# Patient Record
Sex: Male | Born: 1960
Health system: Southern US, Community
[De-identification: ages and names within clinical notes are randomized; demographics above are authoritative.]

## PROBLEM LIST (undated history)

## (undated) DIAGNOSIS — I10 Essential (primary) hypertension: Secondary | ICD-10-CM

---

## 2014-11-20 ENCOUNTER — Other Ambulatory Visit: Payer: Self-pay | Admitting: Specialist

## 2014-11-20 ENCOUNTER — Ambulatory Visit
Admission: RE | Admit: 2014-11-20 | Discharge: 2014-11-20 | Disposition: A | Discharge status: Home / Self Care | Payer: 59 | Source: Ambulatory Visit | Attending: Specialist | Admitting: Specialist

## 2014-11-20 DIAGNOSIS — M545 Low back pain: Secondary | ICD-10-CM

## 2014-12-12 ENCOUNTER — Other Ambulatory Visit: Payer: Self-pay | Admitting: Specialist

## 2014-12-12 DIAGNOSIS — M545 Low back pain: Secondary | ICD-10-CM

## 2014-12-17 ENCOUNTER — Ambulatory Visit
Admission: RE | Admit: 2014-12-17 | Discharge: 2014-12-17 | Disposition: A | Discharge status: Home / Self Care | Payer: 59 | Source: Ambulatory Visit | Attending: Specialist | Admitting: Specialist

## 2014-12-17 ENCOUNTER — Ambulatory Visit: Payer: Self-pay | Admitting: Orthopedic Surgery

## 2014-12-17 DIAGNOSIS — M545 Low back pain: Secondary | ICD-10-CM

## 2014-12-21 ENCOUNTER — Other Ambulatory Visit: Payer: 59

## 2015-03-13 DIAGNOSIS — R350 Frequency of micturition: Secondary | ICD-10-CM | POA: Diagnosis not present

## 2015-03-13 DIAGNOSIS — Z23 Encounter for immunization: Secondary | ICD-10-CM | POA: Diagnosis not present

## 2015-03-13 DIAGNOSIS — M545 Low back pain: Secondary | ICD-10-CM | POA: Diagnosis not present

## 2015-03-13 DIAGNOSIS — Z0000 Encounter for general adult medical examination without abnormal findings: Secondary | ICD-10-CM | POA: Diagnosis not present

## 2015-03-17 DIAGNOSIS — Z125 Encounter for screening for malignant neoplasm of prostate: Secondary | ICD-10-CM | POA: Diagnosis not present

## 2015-05-01 DIAGNOSIS — I119 Hypertensive heart disease without heart failure: Secondary | ICD-10-CM | POA: Diagnosis not present

## 2015-05-01 DIAGNOSIS — M545 Low back pain: Secondary | ICD-10-CM | POA: Diagnosis not present

## 2015-05-22 DIAGNOSIS — I119 Hypertensive heart disease without heart failure: Secondary | ICD-10-CM | POA: Diagnosis not present

## 2015-05-22 DIAGNOSIS — M1990 Unspecified osteoarthritis, unspecified site: Secondary | ICD-10-CM | POA: Diagnosis not present

## 2015-06-12 DIAGNOSIS — I119 Hypertensive heart disease without heart failure: Secondary | ICD-10-CM | POA: Diagnosis not present

## 2015-06-17 DIAGNOSIS — M5416 Radiculopathy, lumbar region: Secondary | ICD-10-CM | POA: Diagnosis not present

## 2015-06-24 DIAGNOSIS — M5416 Radiculopathy, lumbar region: Secondary | ICD-10-CM | POA: Diagnosis not present

## 2015-06-26 DIAGNOSIS — M5416 Radiculopathy, lumbar region: Secondary | ICD-10-CM | POA: Diagnosis not present

## 2015-07-02 DIAGNOSIS — M5416 Radiculopathy, lumbar region: Secondary | ICD-10-CM | POA: Diagnosis not present

## 2015-07-06 DIAGNOSIS — M5416 Radiculopathy, lumbar region: Secondary | ICD-10-CM | POA: Diagnosis not present

## 2015-07-10 DIAGNOSIS — M5416 Radiculopathy, lumbar region: Secondary | ICD-10-CM | POA: Diagnosis not present

## 2015-07-10 DIAGNOSIS — I119 Hypertensive heart disease without heart failure: Secondary | ICD-10-CM | POA: Diagnosis not present

## 2015-07-10 DIAGNOSIS — M1990 Unspecified osteoarthritis, unspecified site: Secondary | ICD-10-CM | POA: Diagnosis not present

## 2015-07-16 DIAGNOSIS — M5416 Radiculopathy, lumbar region: Secondary | ICD-10-CM | POA: Diagnosis not present

## 2015-07-20 DIAGNOSIS — M5416 Radiculopathy, lumbar region: Secondary | ICD-10-CM | POA: Diagnosis not present

## 2015-07-20 DIAGNOSIS — G8929 Other chronic pain: Secondary | ICD-10-CM | POA: Diagnosis not present

## 2015-07-20 DIAGNOSIS — M546 Pain in thoracic spine: Secondary | ICD-10-CM | POA: Diagnosis not present

## 2015-07-23 DIAGNOSIS — M5416 Radiculopathy, lumbar region: Secondary | ICD-10-CM | POA: Diagnosis not present

## 2015-07-27 DIAGNOSIS — M5416 Radiculopathy, lumbar region: Secondary | ICD-10-CM | POA: Diagnosis not present

## 2015-07-30 DIAGNOSIS — M5416 Radiculopathy, lumbar region: Secondary | ICD-10-CM | POA: Diagnosis not present

## 2015-08-03 DIAGNOSIS — M5416 Radiculopathy, lumbar region: Secondary | ICD-10-CM | POA: Diagnosis not present

## 2015-08-06 DIAGNOSIS — M5416 Radiculopathy, lumbar region: Secondary | ICD-10-CM | POA: Diagnosis not present

## 2015-08-10 DIAGNOSIS — M5416 Radiculopathy, lumbar region: Secondary | ICD-10-CM | POA: Diagnosis not present

## 2015-08-13 DIAGNOSIS — M5416 Radiculopathy, lumbar region: Secondary | ICD-10-CM | POA: Diagnosis not present

## 2015-08-17 DIAGNOSIS — K648 Other hemorrhoids: Secondary | ICD-10-CM | POA: Diagnosis not present

## 2015-08-17 DIAGNOSIS — K645 Perianal venous thrombosis: Secondary | ICD-10-CM | POA: Diagnosis not present

## 2015-08-17 DIAGNOSIS — Z1211 Encounter for screening for malignant neoplasm of colon: Secondary | ICD-10-CM | POA: Diagnosis not present

## 2015-08-18 DIAGNOSIS — M5416 Radiculopathy, lumbar region: Secondary | ICD-10-CM | POA: Diagnosis not present

## 2015-08-21 DIAGNOSIS — I119 Hypertensive heart disease without heart failure: Secondary | ICD-10-CM | POA: Diagnosis not present

## 2015-08-31 DIAGNOSIS — M5416 Radiculopathy, lumbar region: Secondary | ICD-10-CM | POA: Diagnosis not present

## 2015-09-02 DIAGNOSIS — H5203 Hypermetropia, bilateral: Secondary | ICD-10-CM | POA: Diagnosis not present

## 2015-09-03 DIAGNOSIS — M5416 Radiculopathy, lumbar region: Secondary | ICD-10-CM | POA: Diagnosis not present

## 2015-09-09 DIAGNOSIS — M5416 Radiculopathy, lumbar region: Secondary | ICD-10-CM | POA: Diagnosis not present

## 2015-09-15 DIAGNOSIS — M5416 Radiculopathy, lumbar region: Secondary | ICD-10-CM | POA: Diagnosis not present

## 2015-09-21 DIAGNOSIS — M5416 Radiculopathy, lumbar region: Secondary | ICD-10-CM | POA: Diagnosis not present

## 2015-09-24 DIAGNOSIS — M5416 Radiculopathy, lumbar region: Secondary | ICD-10-CM | POA: Diagnosis not present

## 2015-09-28 DIAGNOSIS — M5416 Radiculopathy, lumbar region: Secondary | ICD-10-CM | POA: Diagnosis not present

## 2015-10-08 DIAGNOSIS — M546 Pain in thoracic spine: Secondary | ICD-10-CM | POA: Diagnosis not present

## 2015-10-19 DIAGNOSIS — M546 Pain in thoracic spine: Secondary | ICD-10-CM | POA: Diagnosis not present

## 2015-10-26 DIAGNOSIS — M546 Pain in thoracic spine: Secondary | ICD-10-CM | POA: Diagnosis not present

## 2015-11-23 DIAGNOSIS — M5416 Radiculopathy, lumbar region: Secondary | ICD-10-CM | POA: Diagnosis not present

## 2015-12-18 DIAGNOSIS — I10 Essential (primary) hypertension: Secondary | ICD-10-CM | POA: Diagnosis not present

## 2015-12-18 DIAGNOSIS — M1990 Unspecified osteoarthritis, unspecified site: Secondary | ICD-10-CM | POA: Diagnosis not present

## 2016-05-06 DIAGNOSIS — Z0000 Encounter for general adult medical examination without abnormal findings: Secondary | ICD-10-CM | POA: Diagnosis not present

## 2016-05-06 DIAGNOSIS — M5136 Other intervertebral disc degeneration, lumbar region: Secondary | ICD-10-CM | POA: Diagnosis not present

## 2016-05-06 DIAGNOSIS — Z6841 Body Mass Index (BMI) 40.0 and over, adult: Secondary | ICD-10-CM | POA: Diagnosis not present

## 2016-05-06 DIAGNOSIS — K219 Gastro-esophageal reflux disease without esophagitis: Secondary | ICD-10-CM | POA: Diagnosis not present

## 2016-05-06 DIAGNOSIS — I10 Essential (primary) hypertension: Secondary | ICD-10-CM | POA: Diagnosis not present

## 2016-05-10 DIAGNOSIS — I10 Essential (primary) hypertension: Secondary | ICD-10-CM | POA: Diagnosis not present

## 2016-07-01 DIAGNOSIS — I10 Essential (primary) hypertension: Secondary | ICD-10-CM | POA: Diagnosis not present

## 2016-07-01 DIAGNOSIS — K219 Gastro-esophageal reflux disease without esophagitis: Secondary | ICD-10-CM | POA: Diagnosis not present

## 2016-07-14 DIAGNOSIS — K210 Gastro-esophageal reflux disease with esophagitis: Secondary | ICD-10-CM | POA: Diagnosis not present

## 2016-07-14 DIAGNOSIS — K219 Gastro-esophageal reflux disease without esophagitis: Secondary | ICD-10-CM | POA: Diagnosis not present

## 2016-11-18 DIAGNOSIS — R030 Elevated blood-pressure reading, without diagnosis of hypertension: Secondary | ICD-10-CM | POA: Diagnosis not present

## 2016-11-18 DIAGNOSIS — K210 Gastro-esophageal reflux disease with esophagitis: Secondary | ICD-10-CM | POA: Diagnosis not present

## 2017-06-17 DIAGNOSIS — R55 Syncope and collapse: Secondary | ICD-10-CM | POA: Diagnosis not present

## 2017-06-17 DIAGNOSIS — R42 Dizziness and giddiness: Secondary | ICD-10-CM | POA: Diagnosis not present

## 2017-06-17 DIAGNOSIS — R1111 Vomiting without nausea: Secondary | ICD-10-CM | POA: Diagnosis not present

## 2017-06-27 DIAGNOSIS — R197 Diarrhea, unspecified: Secondary | ICD-10-CM | POA: Diagnosis not present

## 2017-07-12 DIAGNOSIS — R7982 Elevated C-reactive protein (CRP): Secondary | ICD-10-CM | POA: Diagnosis not present

## 2017-07-12 DIAGNOSIS — R110 Nausea: Secondary | ICD-10-CM | POA: Diagnosis not present

## 2017-07-12 DIAGNOSIS — R945 Abnormal results of liver function studies: Secondary | ICD-10-CM | POA: Diagnosis not present

## 2017-07-21 DIAGNOSIS — I10 Essential (primary) hypertension: Secondary | ICD-10-CM | POA: Diagnosis not present

## 2017-07-21 DIAGNOSIS — Z6841 Body Mass Index (BMI) 40.0 and over, adult: Secondary | ICD-10-CM | POA: Diagnosis not present

## 2017-08-22 DIAGNOSIS — B029 Zoster without complications: Secondary | ICD-10-CM | POA: Diagnosis not present

## 2017-09-08 DIAGNOSIS — B0230 Zoster ocular disease, unspecified: Secondary | ICD-10-CM | POA: Diagnosis not present

## 2018-03-28 ENCOUNTER — Emergency Department (HOSPITAL_COMMUNITY)
Admission: EM | Admit: 2018-03-28 | Discharge: 2018-03-28 | Disposition: A | Discharge status: Home / Self Care | Payer: 59 | Attending: Emergency Medicine | Admitting: Emergency Medicine

## 2018-03-28 ENCOUNTER — Encounter (HOSPITAL_COMMUNITY): Payer: Self-pay

## 2018-03-28 DIAGNOSIS — R55 Syncope and collapse: Secondary | ICD-10-CM | POA: Diagnosis not present

## 2018-03-28 DIAGNOSIS — I10 Essential (primary) hypertension: Secondary | ICD-10-CM | POA: Insufficient documentation

## 2018-03-28 DIAGNOSIS — E860 Dehydration: Secondary | ICD-10-CM | POA: Insufficient documentation

## 2018-03-28 DIAGNOSIS — R001 Bradycardia, unspecified: Secondary | ICD-10-CM | POA: Diagnosis present

## 2018-03-28 HISTORY — DX: Essential (primary) hypertension: I10

## 2018-03-28 LAB — COMPREHENSIVE METABOLIC PANEL
ALBUMIN: 4.4 g/dL (ref 3.5–5.0)
ALT: 26 U/L (ref 0–44)
AST: 24 U/L (ref 15–41)
Alkaline Phosphatase: 61 U/L (ref 38–126)
Anion gap: 8 (ref 5–15)
BUN: 25 mg/dL — ABNORMAL HIGH (ref 6–20)
CO2: 26 mmol/L (ref 22–32)
CREATININE: 1.13 mg/dL (ref 0.61–1.24)
Calcium: 9.2 mg/dL (ref 8.9–10.3)
Chloride: 106 mmol/L (ref 98–111)
GFR calc Af Amer: >60 mL/min (ref >60)
GFR calc non Af Amer: >60 mL/min (ref >60)
Glucose, Bld: 137 mg/dL — ABNORMAL HIGH (ref 70–99)
Potassium: 3.8 mmol/L (ref 3.5–5.1)
Sodium: 140 mmol/L (ref 135–145)
Total Bilirubin: 0.9 mg/dL (ref 0.3–1.2)
Total Protein: 7.6 g/dL (ref 6.5–8.1)

## 2018-03-28 LAB — CBC
HCT: 47.6 % (ref 39.0–52.0)
Hemoglobin: 15.6 g/dL (ref 13.0–17.0)
MCH: 29.3 pg (ref 26.0–34.0)
MCHC: 32.8 g/dL (ref 30.0–36.0)
MCV: 89.5 fL (ref 80.0–100.0)
Platelets: 300 10*3/uL (ref 150–400)
RBC: 5.32 MIL/uL (ref 4.22–5.81)
RDW: 12.5 % (ref 11.5–15.5)
WBC: 7.6 10*3/uL (ref 4.0–10.5)
nRBC: 0 % (ref 0.0–0.2)

## 2018-03-28 LAB — URINALYSIS, ROUTINE W REFLEX MICROSCOPIC
Bilirubin Urine: NEGATIVE
Glucose, UA: NEGATIVE mg/dL
Hgb urine dipstick: NEGATIVE
Ketones, ur: NEGATIVE mg/dL
Leukocytes,Ua: NEGATIVE
Nitrite: NEGATIVE
Protein, ur: NEGATIVE mg/dL
SPECIFIC GRAVITY, URINE: 1.018 (ref 1.005–1.030)
pH: 5 (ref 5.0–8.0)

## 2018-03-28 LAB — CBG MONITORING, ED: Glucose-Capillary: 106 mg/dL — ABNORMAL HIGH (ref 70–99)

## 2018-03-28 LAB — TROPONIN I
Troponin I: <0.03 ng/mL (ref <0.03)
Troponin I: <0.03 ng/mL (ref <0.03)

## 2018-03-28 MED ORDER — LOSARTAN POTASSIUM 50 MG PO TABS: 50.0000 mg | ORAL_TABLET | Freq: Every day | ORAL | 0 refills | Status: DC

## 2018-03-28 MED ORDER — ONDANSETRON HCL 4 MG/2ML IJ SOLN: 4.0000 mg | Freq: Once | INTRAMUSCULAR | Status: DC

## 2018-03-28 MED ORDER — SODIUM CHLORIDE 0.9 % IV BOLUS
1000.0000 mL | Freq: Once | INTRAVENOUS | Status: AC
  Administered 2018-03-28: 1000 mL via INTRAVENOUS

## 2018-03-28 MED ORDER — SODIUM CHLORIDE 0.9 % IV BOLUS
500.0000 mL | Freq: Once | INTRAVENOUS | Status: AC
  Administered 2018-03-28: 500 mL via INTRAVENOUS

## 2018-03-28 NOTE — ED Notes (Signed)
Save tubes sent to lab. All colors.

## 2018-03-28 NOTE — ED Notes (Signed)
Venora Maples, MD ambulated patient around unit. No problems noted. Patient states, "I feel a lot better".

## 2018-03-28 NOTE — ED Notes (Signed)
Family at bedside. Patient using cell phone. Patient also has ice chips and is ok per Venora Maples, MD.

## 2018-03-28 NOTE — ED Notes (Signed)
Campos, MD at bedside.  

## 2018-03-28 NOTE — ED Triage Notes (Signed)
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 Patient was standing while talking on his phone and felt "funny". Patient sat down.   Patient denies falling or LOC. Patient passed out in a chair. Patient denies being over or standing up fast.   Noted to be pale.    A/Ox4  Patient has not ate today which in not unusual for him.   Patient states he has one other episode similar to this at home last June.

## 2018-03-28 NOTE — ED Notes (Signed)
Patient refusing wheelchair. Patient left with wife. Per Venora Maples, MD discharge patient regardless of 2nd troponin coming back.

## 2018-03-28 NOTE — ED Provider Notes (Signed)
[place] [facility] DEPT Provider Note   CSN: [id number] Arrival date & time: 03/28/18  0859    History   Chief Complaint Chief Complaint  Patient presents with   Bradycardia    HPI Blake Khan is a 58 y.o. male.     HPI Patient is a 58 year old male presents the emergency department complaints of syncopal episode while at work today.  He had preceding lightheadedness without palpitations or chest discomfort.  He felt slightly nauseated.  He was walking in the hall and sat down in a chair and the next thing he knew everyone was standing around him.  He had a similar syncopal episode back in the summer 2019.  This was transient.  EMS was called at that time.  He never sought evaluation in the emergency department at that time.  He did follow-up with his primary care physician and had no significant abnormalities on additional work-up besides mild LFTs that were then rechecked and resolved.  On my initial evaluation in the cancer center the patient was pale and clammy.  His initial heart rate was 44 on hand-held pulse oximeter without pulse ox 97%.  He had a faint radial pulse.  He was alert and talking.  No preceding chest pain or palpitations.  Denies abdominal and back pain.  On transport to the emergency department he began to feel better.  He reports some mild indigestion-like symptoms over the week with burning in his subxiphoid region with associated burping he had none of those symptoms today.Marland Kitchen  No prior history of cardiac disease.  None of the symptoms are present at this time.  No recent illness.  No cough or congestion.  No fevers or chills.  Denies melena or hematochezia.  No recent change in his medications.  He continues to bike often and has no chest discomfort or shortness of breath associated with this.  He states he went on a long ride this weekend without any significant difficulty.  He does report mild decreased oral intake in general and tends to  probably not drink enough fluid.  No other recent sick contacts.  Past Medical History:  Diagnosis Date   Hypertension     There are no active problems to display for this patient.   History reviewed. No pertinent surgical history.      Home Medications    Prior to Admission medications   Not on File    Family History History reviewed. No pertinent family history.  Social History Social History   Tobacco Use   Smoking status: Never Smoker   Smokeless tobacco: Never Used  Substance Use Topics   Alcohol use: Yes   Drug use: Never     Allergies   Sulfa antibiotics   Review of Systems Review of Systems  All other systems reviewed and are negative.    Physical Exam Updated Vital Signs BP 115/80 (BP Location: Left Arm)    Pulse (!) 52    Temp (!) 97.4 F (36.3 C) (Oral)    Resp 15    SpO2 100%   Physical Exam Vitals signs and nursing note reviewed.  Constitutional:      Appearance: Normal appearance. He is well-developed.  HENT:     Head: Normocephalic and atraumatic.     Mouth/Throat:     Mouth: Mucous membranes are dry.  Neck:     Musculoskeletal: Normal range of motion.  Cardiovascular:     Rate and Rhythm: Normal rate and regular rhythm.  Heart sounds: Normal heart sounds.  Pulmonary:     Effort: Pulmonary effort is normal. No respiratory distress.     Breath sounds: Normal breath sounds.  Abdominal:     General: There is no distension.     Palpations: Abdomen is soft.     Tenderness: There is no abdominal tenderness.  Musculoskeletal: Normal range of motion.  Skin:    General: Skin is warm and dry.     Coloration: Skin is not jaundiced.  Neurological:     General: No focal deficit present.     Mental Status: He is alert and oriented to person, place, and time.  Psychiatric:        Mood and Affect: Mood normal.        Judgment: Judgment normal.      ED Treatments / Results  Labs (all labs ordered are listed, but only  abnormal results are displayed) Labs Reviewed  COMPREHENSIVE METABOLIC PANEL - Abnormal; Notable for the following components:      Result Value   Glucose, Bld 137 (*)    BUN 25 (*)    All other components within normal limits  CBG MONITORING, ED - Abnormal; Notable for the following components:   Glucose-Capillary 106 (*)    All other components within normal limits  CBC  TROPONIN I  URINALYSIS, ROUTINE W REFLEX MICROSCOPIC  TROPONIN I    EKG  ECG interpretation #1 0900am   Date: 03/28/2018  Rate: 51  Rhythm: normal sinus rhythm  QRS Axis: normal  Intervals: normal  ST/T Wave abnormalities: early repol pattern. No depression Conduction Disutrbances: none  Narrative Interpretation:   Old EKG Reviewed: no ecg available    ECG interpretation #2 0909am  Date: 03/28/2018  Rate: 54  Rhythm: normal sinus rhythm  QRS Axis: normal  Intervals: normal  ST/T Wave abnormalities:  early repol pattern. No depression   Conduction Disutrbances: none  Narrative Interpretation:   Old EKG Reviewed: No significant changes noted     EKG Interpretation #3 Date/Time:  Wednesday March 28 2018 09:09:45 EDT Ventricular Rate:  54 PR Interval:    QRS Duration: 102 QT Interval:  435 QTC Calculation: 413 R Axis:   73 Text Interpretation:  Sinus rhythm No significant change was found Confirmed by Jola Schmidt [id number]) on 03/28/2018 10:02:21 AM    Radiology No results found.  Procedures Procedures (including critical care time)  Medications Ordered in ED Medications  ondansetron (ZOFRAN) injection 4 mg (0 mg Intravenous Hold 03/28/18 1003)  sodium chloride 0.9 % bolus 500 mL (0 mLs Intravenous Stopped 03/28/18 0957)  sodium chloride 0.9 % bolus 1,000 mL (0 mLs Intravenous Stopped 03/28/18 1114)     Initial Impression / Assessment and Plan / ED Course  I have reviewed the triage vital signs and the nursing notes.  Pertinent labs & imaging results that were available during my  care of the patient were reviewed by me and considered in my medical decision making (see chart for details).        Patient observed in the emergency department for 2 hours.  Received 1.5 L of IV fluids.  Feels much better.  This may have represented dehydration with an associated vagal episode.  No preceding chest pain or palpitations.  EKG x3 without ischemic changes.  Troponin negative.  Labs demonstrate no significant abnormality.  He may have a slightly hemoconcentrated hemoglobin of 15.6.  This would argue for mild dehydration.  Initially after a 500 cc of fluid he  still felt lightheaded when he stood up.  After additional 1 L he felt much better.  We ambulated around the emergency department without any difficulty.  He feels much better.  We will take his losartan and decrease it by half.  A new prescription was called in.  He will follow-up with his primary care physician next week for recheck as well as a repeat evaluation of his blood pressure.  He understands return to the emergency department for any new or worsening symptoms.  Close PCP follow-up.  Patient agreeable to outpatient plan.  Plan described at length to the patient as well as the patient's spouse.  Encouraged ongoing oral hydration at home  Final Clinical Impressions(s) / ED Diagnoses   Final diagnoses:  Vasovagal syncope  Dehydration    ED Discharge Orders         Ordered    losartan (COZAAR) 50 MG tablet  Daily     03/28/18 1121           Jola Schmidt, MD 03/28/18 1135

## 2018-03-28 NOTE — ED Notes (Signed)
Pt has been informed that urine collection is needed  

## 2018-03-28 NOTE — ED Notes (Signed)
Bed: RESA Expected date: 03/28/18 Expected time: 8:51 AM Means of arrival:  Comments: Low heart rate

## 2018-03-28 NOTE — ED Notes (Signed)
Per Venora Maples, MD patient can PO fluids and food. Patient given diet coke and crackers with peanut butter.

## 2018-04-06 DIAGNOSIS — R55 Syncope and collapse: Secondary | ICD-10-CM | POA: Diagnosis not present

## 2018-05-30 ENCOUNTER — Encounter: Payer: Self-pay | Admitting: Radiation Oncology

## 2018-05-30 ENCOUNTER — Other Ambulatory Visit: Payer: Self-pay | Admitting: Radiation Oncology

## 2018-05-30 ENCOUNTER — Other Ambulatory Visit (HOSPITAL_COMMUNITY)
Admission: RE | Admit: 2018-05-30 | Discharge: 2018-05-30 | Disposition: A | Discharge status: Home / Self Care | Payer: 59 | Source: Other Acute Inpatient Hospital | Attending: Radiation Oncology | Admitting: Radiation Oncology

## 2018-05-30 DIAGNOSIS — Z1159 Encounter for screening for other viral diseases: Secondary | ICD-10-CM | POA: Diagnosis not present

## 2018-05-30 LAB — SARS CORONAVIRUS 2 BY RT PCR (HOSPITAL ORDER, PERFORMED IN ~~LOC~~ HOSPITAL LAB): SARS Coronavirus 2: NEGATIVE

## 2018-05-30 NOTE — Progress Notes (Signed)
COVID test ordered stat to be done in [facility] lab due to symptoms.

## 2018-06-05 ENCOUNTER — Other Ambulatory Visit: Payer: Self-pay | Admitting: *Deleted

## 2018-06-05 DIAGNOSIS — R55 Syncope and collapse: Secondary | ICD-10-CM

## 2018-06-06 ENCOUNTER — Telehealth: Payer: Self-pay | Admitting: *Deleted

## 2018-06-06 NOTE — Telephone Encounter (Signed)
Confirmed patient received address confirmation call from [employer].  They will ship a 14 day Cardiac event monitor to his home. Upon receipt ,review instructions, apply monitor, and call [employer] at [phone] to send baseline recording.

## 2018-06-09 ENCOUNTER — Ambulatory Visit (INDEPENDENT_AMBULATORY_CARE_PROVIDER_SITE_OTHER): Payer: 59

## 2018-06-09 DIAGNOSIS — R55 Syncope and collapse: Secondary | ICD-10-CM | POA: Diagnosis not present

## 2018-07-11 ENCOUNTER — Other Ambulatory Visit: Payer: Self-pay | Admitting: Internal Medicine

## 2018-07-11 DIAGNOSIS — R55 Syncope and collapse: Secondary | ICD-10-CM

## 2018-08-10 DIAGNOSIS — I10 Essential (primary) hypertension: Secondary | ICD-10-CM | POA: Diagnosis not present

## 2018-08-10 DIAGNOSIS — K210 Gastro-esophageal reflux disease with esophagitis: Secondary | ICD-10-CM | POA: Diagnosis not present

## 2018-09-26 DIAGNOSIS — H5203 Hypermetropia, bilateral: Secondary | ICD-10-CM | POA: Diagnosis not present

## 2019-08-21 ENCOUNTER — Other Ambulatory Visit: Payer: Self-pay

## 2019-08-21 ENCOUNTER — Ambulatory Visit: Payer: Self-pay

## 2019-08-21 ENCOUNTER — Encounter: Payer: Self-pay | Admitting: Family Medicine

## 2019-08-21 ENCOUNTER — Ambulatory Visit (INDEPENDENT_AMBULATORY_CARE_PROVIDER_SITE_OTHER): Payer: 59 | Admitting: Family Medicine

## 2019-08-21 VITALS — BP 148/90 | HR 68 | Ht 72.5 in | Wt 215.2 lb

## 2019-08-21 DIAGNOSIS — M9261 Juvenile osteochondrosis of tarsus, right ankle: Secondary | ICD-10-CM | POA: Diagnosis not present

## 2019-08-21 DIAGNOSIS — M9262 Juvenile osteochondrosis of tarsus, left ankle: Secondary | ICD-10-CM

## 2019-08-21 DIAGNOSIS — M722 Plantar fascial fibromatosis: Secondary | ICD-10-CM | POA: Diagnosis not present

## 2019-08-21 NOTE — Patient Instructions (Addendum)
Thank you for coming in today. Consider a night splint. Do the ice massage  Do the eccentric exercises.  Go down slowly. Do about 30 reps 2-3x daily.  If not improving let me know.  Can do xray or MRI.

## 2019-08-21 NOTE — Progress Notes (Signed)
° ° °  Subjective:    CC: B foot pain  I, Molly Weber, LAT, ATC, am serving as scribe for Dr. Lynne Leader.  HPI: Pt is a 59 y/o male presenting w/ c/o B heel and plantar foot pain x 2 months.  R foot is worse than L.  He notes pain at the posterior and plantar right heel and on the plantar surface of his entire foot to the forefoot.  He notes the pain is sometimes burning.  Pain is typically worse with prolonged standing.  He is an avid cyclist and notes that although he does not have pain with cycling he is wondering if it is making it worse.  He cannot recall any injuries or potential other causes of pain.  Aggravating factors: Standing, walking, driving long distances Treatments tried: stretching; ice; shoe inserts from a running store; heel cups  Pertinent review of Systems: No fevers or chills  Relevant historical information: History lumbar fusion at L5-S1.  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   Objective:    Vitals:   08/21/19 1321  BP: (!) 148/90  Pulse: 68  SpO2: 97%   General: Well Developed, well nourished, and in no acute distress.   MSK: Right foot prominence at medial posterior calcaneus Achilles tendon insertion. Normal-appearing otherwise. Tender palpation at posterior medial calcaneus.  Not particularly tender plantar calcaneus or forefoot or midfoot. Normal foot and ankle motion. Normal strength. Pulses cap refill and sensation are intact distally.  Left foot largely normal-appearing Not particularly tender. Normal foot and ankle motion. Pulses cap refill and sensation are intact.  Lab and Radiology Results  Diagnostic Limited MSK Ultrasound of: Right foot Achilles tendon normal-appearing At insertion increased hyperechoic change consistent with calcific tendinopathy. Tiny retrocalcaneal bursa also present. Plantar calcaneus normal-appearing with normal-appearing plantar fascia tracking towards forefoot. Impression: Achilles tendinitis with  calcific change.  Possible plantar fasciitis  Diagnostic Limited MSK Ultrasound of: Left foot Normal Achilles tendon tilt insertion.  Increased hyperechoic change at Achilles tendon insertion onto posterior calcaneus consistent with calcific tendinopathy Normal-appearing plantar fascia Impression: Achilles tendinitis calcific change     Impression and Recommendations:    Assessment and Plan: 59 y.o. male with right worse than left plantar and posterior foot pain.  Most consistent with Achilles tendinitis and plantar fasciitis.  Patient describes a burning sensation in the plantar right foot that may be radicular or neuropathy related. Plan to maximize conservative management letter fasciitis with gel heel cups, night splint, eccentric exercises and Voltaren gel. If not improving would broaden work-up including imaging of foot as well as potential repeat imaging L-spine to evaluate for potential lumbar radiculopathy.  Could also include nerve conduction study as needed in the future..   Orders Placed This Encounter  Procedures   [place] LIMITED JOINT SPACE STRUCTURES LOW BILAT(NO LINKED CHARGES)    Order Specific Question:   Reason for Exam (SYMPTOM  OR DIAGNOSIS REQUIRED)    Answer:   plantar fasciitis    Order Specific Question:   Preferred imaging location?    Answer:   [place]   No orders of the defined types were placed in this encounter.   Discussed warning signs or symptoms. Please see discharge instructions. Patient expresses understanding.   The above documentation has been reviewed and is accurate and complete Lynne Leader, M.D.

## 2019-09-17 ENCOUNTER — Telehealth: Payer: Self-pay | Admitting: Internal Medicine

## 2019-09-17 MED ORDER — PANTOPRAZOLE SODIUM 40 MG PO TBEC: 40.0000 mg | DELAYED_RELEASE_TABLET | Freq: Every day | ORAL | 0 refills | Status: DC

## 2019-09-17 MED ORDER — LOSARTAN POTASSIUM 50 MG PO TABS: 50.0000 mg | ORAL_TABLET | Freq: Every day | ORAL | 0 refills | Status: DC

## 2019-09-17 NOTE — Telephone Encounter (Signed)
OK to est PCP w/me  Pls sch appt - best Wed - late PM Text or call Dr Benay Spice at [phone] Meds renewed x 3 mo Thx

## 2019-09-19 NOTE — Telephone Encounter (Signed)
Unable to reach pt and vm not set up, will try again later

## 2019-09-20 NOTE — Telephone Encounter (Signed)
Appointment has been made

## 2019-10-09 ENCOUNTER — Encounter: Payer: Self-pay | Admitting: Internal Medicine

## 2019-10-09 ENCOUNTER — Ambulatory Visit (INDEPENDENT_AMBULATORY_CARE_PROVIDER_SITE_OTHER): Payer: 59 | Admitting: Internal Medicine

## 2019-10-09 ENCOUNTER — Other Ambulatory Visit: Payer: Self-pay

## 2019-10-09 VITALS — BP 150/82 | HR 74 | Temp 98.2°F | Ht 70.05 in | Wt 214.0 lb

## 2019-10-09 DIAGNOSIS — E559 Vitamin D deficiency, unspecified: Secondary | ICD-10-CM

## 2019-10-09 DIAGNOSIS — K219 Gastro-esophageal reflux disease without esophagitis: Secondary | ICD-10-CM | POA: Insufficient documentation

## 2019-10-09 DIAGNOSIS — E785 Hyperlipidemia, unspecified: Secondary | ICD-10-CM

## 2019-10-09 DIAGNOSIS — I10 Essential (primary) hypertension: Secondary | ICD-10-CM | POA: Insufficient documentation

## 2019-10-09 DIAGNOSIS — R55 Syncope and collapse: Secondary | ICD-10-CM | POA: Insufficient documentation

## 2019-10-09 DIAGNOSIS — D485 Neoplasm of uncertain behavior of skin: Secondary | ICD-10-CM | POA: Insufficient documentation

## 2019-10-09 DIAGNOSIS — Z0000 Encounter for general adult medical examination without abnormal findings: Secondary | ICD-10-CM | POA: Insufficient documentation

## 2019-10-09 DIAGNOSIS — H6120 Impacted cerumen, unspecified ear: Secondary | ICD-10-CM | POA: Insufficient documentation

## 2019-10-09 DIAGNOSIS — M545 Low back pain, unspecified: Secondary | ICD-10-CM | POA: Insufficient documentation

## 2019-10-09 DIAGNOSIS — R202 Paresthesia of skin: Secondary | ICD-10-CM

## 2019-10-09 DIAGNOSIS — H6123 Impacted cerumen, bilateral: Secondary | ICD-10-CM

## 2019-10-09 MED ORDER — VITAMIN D3 50 MCG (2000 UT) PO CAPS: 2000.0000 [IU] | ORAL_CAPSULE | Freq: Every day | ORAL | 3 refills | Status: DC

## 2019-10-09 NOTE — Patient Instructions (Signed)

## 2019-10-09 NOTE — Assessment & Plan Note (Signed)
Cor calc CT offered 

## 2019-10-09 NOTE — Assessment & Plan Note (Signed)
Use OTC wax removing kit

## 2019-10-09 NOTE — Progress Notes (Signed)
Subjective:  Patient ID: Blake Khan, male    DOB: 11-07-1960  Age: 59 y.o. MRN: [id number]  CC: No chief complaint on file.   HPI Blake Khan presents for a well exam - a new pt visit F/u on HTN, GERD H/o syncope 03/2018 with a (-) w/up - vaso-vagal  H/o LBP H/o shingles on LLE  Outpatient Medications Prior to Visit  Medication Sig Dispense Refill  . losartan (COZAAR) 50 MG tablet Take 1 tablet (50 mg total) by mouth daily. 90 tablet 0  . pantoprazole (PROTONIX) 40 MG tablet Take 1 tablet (40 mg total) by mouth daily. 90 tablet 0   No facility-administered medications prior to visit.    ROS: Review of Systems  Constitutional: Negative for appetite change, fatigue and unexpected weight change.  HENT: Negative for congestion, nosebleeds, sneezing, sore throat and trouble swallowing.   Eyes: Negative for itching and visual disturbance.  Respiratory: Negative for cough.   Cardiovascular: Negative for chest pain, palpitations and leg swelling.  Gastrointestinal: Negative for abdominal distention, blood in stool, diarrhea and nausea.  Genitourinary: Negative for frequency and hematuria.  Musculoskeletal: Negative for back pain, gait problem, joint swelling and neck pain.  Skin: Negative for rash.  Neurological: Negative for dizziness, tremors, speech difficulty and weakness.  Psychiatric/Behavioral: Negative for agitation, dysphoric mood, sleep disturbance and suicidal ideas. The patient is not nervous/anxious.     Objective:  BP (!) 150/82 (BP Location: Left Arm, Patient Position: Sitting, Cuff Size: Large)   Pulse 74   Temp 98.2 F (36.8 C) (Oral)   Ht 5' 10.05" (1.779 m)   Wt 214 lb (97.1 kg)   SpO2 97%   BMI 30.66 kg/m   BP Readings from Last 3 Encounters:  10/09/19 (!) 150/82  08/21/19 (!) 148/90  03/28/18 (!) 141/89    Wt Readings from Last 3 Encounters:  10/09/19 214 lb (97.1 kg)  08/21/19 215 lb 3.2 oz (97.6 kg)    Physical Exam Constitutional:       General: He is not in acute distress.    Appearance: He is well-developed.     Comments: NAD  Eyes:     Conjunctiva/sclera: Conjunctivae normal.     Pupils: Pupils are equal, round, and reactive to light.  Neck:     Thyroid: No thyromegaly.     Vascular: No JVD.  Cardiovascular:     Rate and Rhythm: Normal rate and regular rhythm.     Heart sounds: Normal heart sounds. No murmur heard.  No friction rub. No gallop.   Pulmonary:     Effort: Pulmonary effort is normal. No respiratory distress.     Breath sounds: Normal breath sounds. No wheezing or rales.  Chest:     Chest wall: No tenderness.  Abdominal:     General: Bowel sounds are normal. There is no distension.     Palpations: Abdomen is soft. There is no mass.     Tenderness: There is no abdominal tenderness. There is no guarding or rebound.  Musculoskeletal:        General: No tenderness. Normal range of motion.     Cervical back: Normal range of motion.  Lymphadenopathy:     Cervical: No cervical adenopathy.  Skin:    General: Skin is warm and dry.     Findings: No rash.  Neurological:     Mental Status: He is alert and oriented to person, place, and time.     Cranial Nerves: No cranial nerve  deficit.     Motor: No abnormal muscle tone.     Coordination: Coordination normal.     Gait: Gait normal.     Deep Tendon Reflexes: Reflexes are normal and symmetric.  Psychiatric:        Behavior: Behavior normal.        Thought Content: Thought content normal.        Judgment: Judgment normal.     Lab Results  Component Value Date   WBC 7.6 03/28/2018   HGB 15.6 03/28/2018   HCT 47.6 03/28/2018   PLT 300 03/28/2018   GLUCOSE 137 (H) 03/28/2018   ALT 26 03/28/2018   AST 24 03/28/2018   NA 140 03/28/2018   K 3.8 03/28/2018   CL 106 03/28/2018   CREATININE 1.13 03/28/2018   BUN 25 (H) 03/28/2018   CO2 26 03/28/2018    No results found.  Assessment & Plan:   Walker Kehr, MD

## 2019-10-09 NOTE — Assessment & Plan Note (Signed)
Losartan Check BP at home; states SBP<130

## 2019-10-09 NOTE — Assessment & Plan Note (Signed)
Recurrent since L5-S1 fusion at 59 yo

## 2019-10-09 NOTE — Assessment & Plan Note (Signed)
We discussed age appropriate health related issues, including available/recomended screening tests and vaccinations. Labs were ordered to be later reviewed . All questions were answered. We discussed one or more of the following - seat belt use, use of sunscreen/sun exposure exercise, safe sex, fall risk reduction, second hand smoke exposure, firearm use and storage, seat belt use, a need for adhering to healthy diet and exercise. Labs were ordered.  All questions were answered. Colon 2017 Dr Earlean Shawl Cor calcium CT offered 9/21 tDAP 2017Hep C(-) 2018 PSA 1.1 2020 Brad declined Shingrx

## 2019-10-09 NOTE — Assessment & Plan Note (Signed)
L temple - ?SD Cryo or shave bx offered

## 2019-10-09 NOTE — Assessment & Plan Note (Signed)
No relapse 

## 2019-10-09 NOTE — Assessment & Plan Note (Signed)
On Protonix 

## 2019-10-15 ENCOUNTER — Other Ambulatory Visit (INDEPENDENT_AMBULATORY_CARE_PROVIDER_SITE_OTHER): Payer: 59

## 2019-10-15 DIAGNOSIS — R202 Paresthesia of skin: Secondary | ICD-10-CM | POA: Diagnosis not present

## 2019-10-15 DIAGNOSIS — E559 Vitamin D deficiency, unspecified: Secondary | ICD-10-CM

## 2019-10-15 DIAGNOSIS — Z125 Encounter for screening for malignant neoplasm of prostate: Secondary | ICD-10-CM

## 2019-10-15 DIAGNOSIS — Z0000 Encounter for general adult medical examination without abnormal findings: Secondary | ICD-10-CM | POA: Diagnosis not present

## 2019-10-15 LAB — URINALYSIS
Bilirubin Urine: NEGATIVE
Hgb urine dipstick: NEGATIVE
Ketones, ur: NEGATIVE
Leukocytes,Ua: NEGATIVE
Nitrite: NEGATIVE
Specific Gravity, Urine: >=1.03 — AB (ref 1.000–1.030)
Total Protein, Urine: NEGATIVE
Urine Glucose: NEGATIVE
Urobilinogen, UA: 0.2 (ref 0.0–1.0)
pH: 5.5 (ref 5.0–8.0)

## 2019-10-15 LAB — CBC WITH DIFFERENTIAL/PLATELET
Basophils Absolute: 0.1 K/uL (ref 0.0–0.1)
Basophils Relative: 1 % (ref 0.0–3.0)
Eosinophils Absolute: 0.2 K/uL (ref 0.0–0.7)
Eosinophils Relative: 2.4 % (ref 0.0–5.0)
HCT: 45.8 % (ref 39.0–52.0)
Hemoglobin: 15.6 g/dL (ref 13.0–17.0)
Lymphocytes Relative: 48.2 % — ABNORMAL HIGH (ref 12.0–46.0)
Lymphs Abs: 3.1 K/uL (ref 0.7–4.0)
MCHC: 34.1 g/dL (ref 30.0–36.0)
MCV: 86.3 fl (ref 78.0–100.0)
Monocytes Absolute: 0.6 K/uL (ref 0.1–1.0)
Monocytes Relative: 9.3 % (ref 3.0–12.0)
Neutro Abs: 2.5 K/uL (ref 1.4–7.7)
Neutrophils Relative %: 39.1 % — ABNORMAL LOW (ref 43.0–77.0)
Platelets: 251 K/uL (ref 150.0–400.0)
RBC: 5.31 Mil/uL (ref 4.22–5.81)
RDW: 13.4 % (ref 11.5–15.5)
WBC: 6.4 K/uL (ref 4.0–10.5)

## 2019-10-15 LAB — VITAMIN D 25 HYDROXY (VIT D DEFICIENCY, FRACTURES): VITD: 21.61 ng/mL — ABNORMAL LOW (ref 30.00–100.00)

## 2019-10-15 LAB — COMPREHENSIVE METABOLIC PANEL
ALT: 23 U/L (ref 0–53)
AST: 21 U/L (ref 0–37)
Albumin: 4.7 g/dL (ref 3.5–5.2)
Alkaline Phosphatase: 62 U/L (ref 39–117)
BUN: 33 mg/dL — ABNORMAL HIGH (ref 6–23)
CO2: 27 mEq/L (ref 19–32)
Calcium: 9.6 mg/dL (ref 8.4–10.5)
Chloride: 105 mEq/L (ref 96–112)
Creatinine, Ser: 1.24 mg/dL (ref 0.40–1.50)
GFR: 59.68 mL/min — ABNORMAL LOW (ref >60.00)
Glucose, Bld: 122 mg/dL — ABNORMAL HIGH (ref 70–99)
Potassium: 4.1 mEq/L (ref 3.5–5.1)
Sodium: 140 mEq/L (ref 135–145)
Total Bilirubin: 0.5 mg/dL (ref 0.2–1.2)
Total Protein: 7.5 g/dL (ref 6.0–8.3)

## 2019-10-15 LAB — LIPID PANEL
Cholesterol: 172 mg/dL (ref 0–200)
HDL: 38.3 mg/dL — ABNORMAL LOW
LDL Cholesterol: 116 mg/dL — ABNORMAL HIGH (ref 0–99)
NonHDL: 133.73
Total CHOL/HDL Ratio: 4
Triglycerides: 88 mg/dL (ref 0.0–149.0)
VLDL: 17.6 mg/dL (ref 0.0–40.0)

## 2019-10-15 LAB — TSH: TSH: 2.33 u[IU]/mL (ref 0.35–4.50)

## 2019-10-15 LAB — PSA: PSA: 1.5 ng/mL (ref 0.10–4.00)

## 2019-10-15 LAB — VITAMIN B12: Vitamin B-12: 206 pg/mL — ABNORMAL LOW (ref 211–911)

## 2019-10-15 NOTE — Addendum Note (Signed)
Addended by: Trenda Moots on: 01/14/9966 07:37 AM   Modules accepted: Orders

## 2019-10-16 ENCOUNTER — Encounter: Payer: Self-pay | Admitting: Internal Medicine

## 2019-10-16 ENCOUNTER — Other Ambulatory Visit: Payer: Self-pay | Admitting: Internal Medicine

## 2019-10-16 DIAGNOSIS — E559 Vitamin D deficiency, unspecified: Secondary | ICD-10-CM

## 2019-10-16 DIAGNOSIS — E538 Deficiency of other specified B group vitamins: Secondary | ICD-10-CM | POA: Insufficient documentation

## 2019-10-16 DIAGNOSIS — R944 Abnormal results of kidney function studies: Secondary | ICD-10-CM

## 2019-10-16 MED ORDER — VITAMIN D3 1.25 MG (50000 UT) PO CAPS: 1.0000 | ORAL_CAPSULE | ORAL | 0 refills | Status: DC

## 2019-10-16 MED ORDER — VITAMIN B-12 1000 MCG SL SUBL: 1.0000 | SUBLINGUAL_TABLET | Freq: Every day | SUBLINGUAL | 3 refills | Status: AC

## 2019-10-16 NOTE — Progress Notes (Signed)
Hi Blake Khan, Your labs show that your vitamin B12 is low at 206.  Your vitamin D is low at 21.  Your glucose was slightly elevated at 123.  Otherwise your tests are good. Please, start Vit D prescription 50000 iu weekly (Rx emailed to your pharmacy) followed by over-the-counter Vit D 2000 iu daily. I will send a prescription for vitamin B12 sublingual to your pharmacy.  Can you get a B12 shot at your office? Your GFR was slightly borderline/decreased.  Please hydrate yourself well.  I would like to obtain a renal ultrasound to be sure.  I will order the test. Can you sign up for Mychart, please? We should repeat your labs in 3 months. Thanks, AP

## 2019-10-18 NOTE — Addendum Note (Signed)
Addended by: Earnstine Regal on: 10/18/2019 01:37 PM   Modules accepted: Orders

## 2019-10-18 NOTE — Progress Notes (Signed)
HAD TO RE-ENTER ORDER DUE TO WRONG LOCATION.Marland Kitchen/LMB

## 2019-10-24 ENCOUNTER — Ambulatory Visit
Admission: RE | Admit: 2019-10-24 | Discharge: 2019-10-24 | Disposition: A | Discharge status: Home / Self Care | Payer: 59 | Source: Ambulatory Visit | Attending: Internal Medicine | Admitting: Internal Medicine

## 2019-10-24 DIAGNOSIS — R944 Abnormal results of kidney function studies: Secondary | ICD-10-CM

## 2019-10-24 DIAGNOSIS — N400 Enlarged prostate without lower urinary tract symptoms: Secondary | ICD-10-CM | POA: Diagnosis not present

## 2019-10-24 DIAGNOSIS — N3289 Other specified disorders of bladder: Secondary | ICD-10-CM | POA: Diagnosis not present

## 2019-11-04 ENCOUNTER — Telehealth: Payer: Self-pay | Admitting: *Deleted

## 2019-11-04 ENCOUNTER — Inpatient Hospital Stay: Payer: 59 | Attending: Internal Medicine

## 2019-11-04 DIAGNOSIS — Z23 Encounter for immunization: Secondary | ICD-10-CM | POA: Diagnosis not present

## 2019-11-04 NOTE — Telephone Encounter (Signed)
Error

## 2019-11-04 NOTE — Progress Notes (Signed)
Patient was observed post Covid-19 immunization for 15 minutes . No reaction noted. Patient was instructed to call 911 with any severe reactions post vaccine:  Difficulty breathing   Swelling of face and throat   A fast heartbeat   A bad rash all over body   Dizziness and weakness   

## 2019-12-23 ENCOUNTER — Other Ambulatory Visit: Payer: Self-pay | Admitting: Internal Medicine

## 2019-12-23 ENCOUNTER — Other Ambulatory Visit: Payer: Self-pay

## 2019-12-23 MED ORDER — PANTOPRAZOLE SODIUM 40 MG PO TBEC: 40.0000 mg | DELAYED_RELEASE_TABLET | Freq: Every day | ORAL | 0 refills | Status: DC

## 2019-12-23 MED ORDER — LOSARTAN POTASSIUM 50 MG PO TABS: 50.0000 mg | ORAL_TABLET | Freq: Every day | ORAL | 0 refills | Status: DC

## 2020-03-30 ENCOUNTER — Other Ambulatory Visit: Payer: Self-pay

## 2020-03-30 ENCOUNTER — Other Ambulatory Visit: Payer: Self-pay | Admitting: Internal Medicine

## 2020-03-30 MED ORDER — LOSARTAN POTASSIUM 50 MG PO TABS: 50.0000 mg | ORAL_TABLET | Freq: Every day | ORAL | 1 refills | Status: DC

## 2020-03-30 MED ORDER — PANTOPRAZOLE SODIUM 40 MG PO TBEC: 40.0000 mg | DELAYED_RELEASE_TABLET | Freq: Every day | ORAL | 1 refills | Status: DC

## 2020-04-02 ENCOUNTER — Other Ambulatory Visit (HOSPITAL_BASED_OUTPATIENT_CLINIC_OR_DEPARTMENT_OTHER): Payer: Self-pay

## 2020-05-22 ENCOUNTER — Other Ambulatory Visit (HOSPITAL_BASED_OUTPATIENT_CLINIC_OR_DEPARTMENT_OTHER): Payer: Self-pay

## 2020-05-22 ENCOUNTER — Ambulatory Visit: Payer: 59 | Attending: Internal Medicine

## 2020-05-22 ENCOUNTER — Other Ambulatory Visit (HOSPITAL_COMMUNITY): Payer: Self-pay

## 2020-05-22 ENCOUNTER — Other Ambulatory Visit: Payer: Self-pay

## 2020-05-22 DIAGNOSIS — Z23 Encounter for immunization: Secondary | ICD-10-CM

## 2020-05-22 MED ORDER — PFIZER-BIONT COVID-19 VAC-TRIS 30 MCG/0.3ML IM SUSP
INTRAMUSCULAR | 0 refills | Status: DC
  Filled 2020-05-22: qty 0.3, 1d supply, fill #0

## 2020-05-22 NOTE — Progress Notes (Signed)
   Covid-19 Vaccination Clinic  Name:  Blake Khan    MRN: [id number] DOB: 15-May-1960  05/22/2020  Mr. Blake Khan was observed post Covid-19 immunization for 15 minutes without incident. He was provided with Vaccine Information Sheet and instruction to access the V-Safe system.   Mr. Blake Khan was instructed to call 911 with any severe reactions post vaccine: Marland Kitchen Difficulty breathing  . Swelling of face and throat  . A fast heartbeat  . A bad rash all over body  . Dizziness and weakness   Immunizations Administered    Name Date Dose VIS Date Route   PFIZER Comrnaty(Gray TOP) Covid-19 Vaccine 05/22/2020  7:48 AM 0.3 mL 12/19/2019 Intramuscular   Manufacturer: [employer], [employer]   Lot: [id number]   NDC: [phone]

## 2020-06-29 ENCOUNTER — Other Ambulatory Visit (HOSPITAL_COMMUNITY): Payer: Self-pay

## 2020-06-29 MED FILL — Losartan Potassium Tab 50 MG: ORAL | 90 days supply | Qty: 90 | Fill #0 | Status: AC

## 2020-06-29 MED FILL — Pantoprazole Sodium EC Tab 40 MG (Base Equiv): ORAL | 90 days supply | Qty: 90 | Fill #0 | Status: AC

## 2020-09-01 ENCOUNTER — Other Ambulatory Visit (HOSPITAL_COMMUNITY): Payer: Self-pay

## 2020-09-01 MED ORDER — PREDNISONE 5 MG (21) PO TBPK
ORAL_TABLET | ORAL | 0 refills | Status: DC
  Filled 2020-09-01: qty 21, 6d supply, fill #0

## 2020-09-15 DIAGNOSIS — M5416 Radiculopathy, lumbar region: Secondary | ICD-10-CM | POA: Diagnosis not present

## 2020-09-22 DIAGNOSIS — M5416 Radiculopathy, lumbar region: Secondary | ICD-10-CM | POA: Diagnosis not present

## 2020-09-29 ENCOUNTER — Other Ambulatory Visit: Payer: Self-pay | Admitting: Internal Medicine

## 2020-09-29 DIAGNOSIS — M5416 Radiculopathy, lumbar region: Secondary | ICD-10-CM | POA: Diagnosis not present

## 2020-09-29 DIAGNOSIS — E538 Deficiency of other specified B group vitamins: Secondary | ICD-10-CM

## 2020-09-29 DIAGNOSIS — E559 Vitamin D deficiency, unspecified: Secondary | ICD-10-CM

## 2020-09-29 DIAGNOSIS — Z0000 Encounter for general adult medical examination without abnormal findings: Secondary | ICD-10-CM

## 2020-09-30 ENCOUNTER — Other Ambulatory Visit (INDEPENDENT_AMBULATORY_CARE_PROVIDER_SITE_OTHER): Payer: 59

## 2020-09-30 DIAGNOSIS — E559 Vitamin D deficiency, unspecified: Secondary | ICD-10-CM

## 2020-09-30 DIAGNOSIS — Z125 Encounter for screening for malignant neoplasm of prostate: Secondary | ICD-10-CM | POA: Diagnosis not present

## 2020-09-30 DIAGNOSIS — Z0000 Encounter for general adult medical examination without abnormal findings: Secondary | ICD-10-CM

## 2020-09-30 DIAGNOSIS — E538 Deficiency of other specified B group vitamins: Secondary | ICD-10-CM

## 2020-09-30 LAB — CBC WITH DIFFERENTIAL/PLATELET
Basophils Absolute: 0.1 10*3/uL (ref 0.0–0.1)
Basophils Relative: 0.8 % (ref 0.0–3.0)
Eosinophils Absolute: 0.1 10*3/uL (ref 0.0–0.7)
Eosinophils Relative: 2 % (ref 0.0–5.0)
HCT: 44.3 % (ref 39.0–52.0)
Hemoglobin: 15.1 g/dL (ref 13.0–17.0)
Lymphocytes Relative: 41.8 % (ref 12.0–46.0)
Lymphs Abs: 2.9 10*3/uL (ref 0.7–4.0)
MCHC: 34.1 g/dL (ref 30.0–36.0)
MCV: 86.8 fl (ref 78.0–100.0)
Monocytes Absolute: 0.6 10*3/uL (ref 0.1–1.0)
Monocytes Relative: 9 % (ref 3.0–12.0)
Neutro Abs: 3.2 10*3/uL (ref 1.4–7.7)
Neutrophils Relative %: 46.4 % (ref 43.0–77.0)
Platelets: 274 10*3/uL (ref 150.0–400.0)
RBC: 5.11 Mil/uL (ref 4.22–5.81)
RDW: 13.5 % (ref 11.5–15.5)
WBC: 6.9 10*3/uL (ref 4.0–10.5)

## 2020-09-30 LAB — URINALYSIS
Bilirubin Urine: NEGATIVE
Hgb urine dipstick: NEGATIVE
Ketones, ur: NEGATIVE
Leukocytes,Ua: NEGATIVE
Nitrite: NEGATIVE
Specific Gravity, Urine: 1.025 (ref 1.000–1.030)
Total Protein, Urine: NEGATIVE
Urine Glucose: NEGATIVE
Urobilinogen, UA: 0.2 — AB (ref 0.0–1.0)
pH: 5.5 (ref 5.0–8.0)

## 2020-09-30 LAB — TSH: TSH: 2.57 u[IU]/mL (ref 0.35–5.50)

## 2020-09-30 LAB — VITAMIN D 25 HYDROXY (VIT D DEFICIENCY, FRACTURES): VITD: 35.41 ng/mL (ref 30.00–100.00)

## 2020-09-30 LAB — PSA: PSA: 1.9 ng/mL (ref 0.10–4.00)

## 2020-09-30 LAB — VITAMIN B12: Vitamin B-12: 357 pg/mL (ref 211–911)

## 2020-10-01 LAB — LIPID PANEL
Cholesterol: 176 mg/dL (ref 0–200)
HDL: 42.3 mg/dL (ref >39.00)
LDL Cholesterol: 109 mg/dL — ABNORMAL HIGH (ref 0–99)
NonHDL: 133.2
Total CHOL/HDL Ratio: 4
Triglycerides: 120 mg/dL (ref 0.0–149.0)
VLDL: 24 mg/dL (ref 0.0–40.0)

## 2020-10-01 LAB — COMPREHENSIVE METABOLIC PANEL
ALT: 21 U/L (ref 0–53)
AST: 20 U/L (ref 0–37)
Albumin: 4.6 g/dL (ref 3.5–5.2)
Alkaline Phosphatase: 59 U/L (ref 39–117)
BUN: 24 mg/dL — ABNORMAL HIGH (ref 6–23)
CO2: 25 mEq/L (ref 19–32)
Calcium: 9.8 mg/dL (ref 8.4–10.5)
Chloride: 103 mEq/L (ref 96–112)
Creatinine, Ser: 1.2 mg/dL (ref 0.40–1.50)
GFR: 66 mL/min (ref >60.00)
Glucose, Bld: 94 mg/dL (ref 70–99)
Potassium: 3.8 mEq/L (ref 3.5–5.1)
Sodium: 141 mEq/L (ref 135–145)
Total Bilirubin: 0.5 mg/dL (ref 0.2–1.2)
Total Protein: 7.6 g/dL (ref 6.0–8.3)

## 2020-10-06 DIAGNOSIS — M5416 Radiculopathy, lumbar region: Secondary | ICD-10-CM | POA: Diagnosis not present

## 2020-10-08 ENCOUNTER — Other Ambulatory Visit: Payer: Self-pay | Admitting: Internal Medicine

## 2020-10-09 ENCOUNTER — Ambulatory Visit (INDEPENDENT_AMBULATORY_CARE_PROVIDER_SITE_OTHER): Payer: 59 | Admitting: Internal Medicine

## 2020-10-09 ENCOUNTER — Other Ambulatory Visit (HOSPITAL_COMMUNITY): Payer: Self-pay

## 2020-10-09 ENCOUNTER — Other Ambulatory Visit: Payer: Self-pay

## 2020-10-09 ENCOUNTER — Encounter: Payer: Self-pay | Admitting: Internal Medicine

## 2020-10-09 VITALS — BP 141/80 | HR 67 | Temp 98.1°F | Ht 70.5 in | Wt 216.0 lb

## 2020-10-09 DIAGNOSIS — I10 Essential (primary) hypertension: Secondary | ICD-10-CM | POA: Diagnosis not present

## 2020-10-09 DIAGNOSIS — Z0000 Encounter for general adult medical examination without abnormal findings: Secondary | ICD-10-CM

## 2020-10-09 DIAGNOSIS — E785 Hyperlipidemia, unspecified: Secondary | ICD-10-CM | POA: Diagnosis not present

## 2020-10-09 MED ORDER — LOSARTAN POTASSIUM 50 MG PO TABS
ORAL_TABLET | Freq: Every day | ORAL | 3 refills | Status: DC
  Filled 2020-10-09: qty 90, 90d supply, fill #0
  Filled 2021-01-12: qty 90, 90d supply, fill #1
  Filled 2021-04-15: qty 90, 90d supply, fill #2
  Filled 2021-07-21: qty 90, 90d supply, fill #3

## 2020-10-09 MED ORDER — PANTOPRAZOLE SODIUM 40 MG PO TBEC
DELAYED_RELEASE_TABLET | Freq: Every day | ORAL | 3 refills | Status: DC
  Filled 2020-10-09: qty 90, 90d supply, fill #0
  Filled 2021-01-12: qty 90, 90d supply, fill #1
  Filled 2021-04-15: qty 90, 90d supply, fill #2
  Filled 2021-07-21: qty 90, 90d supply, fill #3

## 2020-10-09 NOTE — Patient Instructions (Addendum)
The Obesity Code book by Sharman Cheek   These suggestions will probably help you to improve your metabolism if you are not overweight and to lose weight if you are overweight: 1.  Reduce your consumption of sugars and starches.  Eliminate high fructose corn syrup from your diet.  Reduce your consumption of processed foods.  For desserts try to have seasonal fruits, berries, nuts, cheeses or dark chocolate with more than 70% cacao. 2.  Do not snack 3.  You do not have to eat breakfast.  If you choose to have breakfast - eat plain greek yogurt, eggs, oatmeal (without sugar) - use honey if you need to. 4.  Drink water, freshly brewed unsweetened tea (green, black or herbal) or coffee.  Do not drink sodas including diet sodas , juices, beverages sweetened with artificial sweeteners. 5.  Reduce your consumption of refined grains. 6.  Avoid protein drinks such as Optifast, Slim fast etc. Eat chicken, fish, meat, dairy and beans for your sources of protein. 7.  Natural unprocessed fats like cold pressed virgin olive oil, butter, coconut oil are good for you.  Eat avocados. 8.  Increase your consumption of fiber.  Fruits, berries, vegetables, whole grains, flaxseed, chia seeds, beans, popcorn, nuts, oatmeal are good sources of fiber 9.  Use vinegar in your diet, i.e. apple cider vinegar, red wine or balsamic vinegar 10.  You can try fasting.  For example you can skip breakfast and lunch every other day (24-hour fast) 11.  Stress reduction, good night sleep, relaxation, meditation, yoga and other physical activity is likely to help you to maintain low weight too. 12.  If you drink alcohol, limit your alcohol intake to no more than 2 drinks a day.     Cabbage soup recipe that will not make you gain weight: Take 1 small head of cabbage, 1 average pack of celery, 4 green peppers, 4 onions, 2 cans diced tomatoes (they are not available without salt), salt and spices to taste.  Chop cabbage, celery, peppers and  onions.  And tomatoes and 2-2.5 liters (2.5 quarts) of water so that it would just cover the vegetables.  Bring to boil.  Add spices and salt.  Turn heat to low/medium and simmer for 20-25 minutes.  Naturally, you can make a smaller batch and change some of the ingredients.  Cardiac CT calcium scoring test $99 Tel # is [phone]   Computed tomography, more commonly known as a CT or CAT scan, is a diagnostic medical imaging test. Like traditional x-rays, it produces multiple images or pictures of the inside of the body. The cross-sectional images generated during a CT scan can be reformatted in multiple planes. They can even generate three-dimensional images. These images can be viewed on a computer monitor, printed on film or by a 3D printer, or transferred to a CD or DVD. CT images of internal organs, bones, soft tissue and blood vessels provide greater detail than traditional x-rays, particularly of soft tissues and blood vessels. A cardiac CT scan for coronary calcium is a non-invasive way of obtaining information about the presence, location and extent of calcified plaque in the coronary arteries--the vessels that supply oxygen-containing blood to the heart muscle. Calcified plaque results when there is a build-up of fat and other substances under the inner layer of the artery. This material can calcify which signals the presence of atherosclerosis, a disease of the vessel wall, also called coronary artery disease (CAD). People with this disease have an increased risk  for heart attacks. In addition, over time, progression of plaque build up (CAD) can narrow the arteries or even close off blood flow to the heart. The result may be chest pain, sometimes called "angina," or a heart attack. Because calcium is a marker of CAD, the amount of calcium detected on a cardiac CT scan is a helpful prognostic tool. The findings on cardiac CT are expressed as a calcium score. Another name for this test is  coronary artery calcium scoring.  What are some common uses of the procedure? The goal of cardiac CT scan for calcium scoring is to determine if CAD is present and to what extent, even if there are no symptoms. It is a screening study that may be recommended by a physician for patients with risk factors for CAD but no clinical symptoms. The major risk factors for CAD are: high blood cholesterol levels  family history of heart attacks  diabetes  high blood pressure  cigarette smoking  overweight or obese  physical inactivity   A negative cardiac CT scan for calcium scoring shows no calcification within the coronary arteries. This suggests that CAD is absent or so minimal it cannot be seen by this technique. The chance of having a heart attack over the next two to five years is very low under these circumstances. A positive test means that CAD is present, regardless of whether or not the patient is experiencing any symptoms. The amount of calcification--expressed as the calcium score--may help to predict the likelihood of a myocardial infarction (heart attack) in the coming years and helps your medical doctor or cardiologist decide whether the patient may need to take preventive medicine or undertake other measures such as diet and exercise to lower the risk for heart attack. The extent of CAD is graded according to your calcium score:  Calcium Score  Presence of CAD (coronary artery disease)  0 No evidence of CAD   1-10 Minimal evidence of CAD  11-100 Mild evidence of CAD  101-400 Moderate evidence of CAD  Over 400 Extensive evidence of CAD   Coronary artery calcium (CAC) score is a strong predictor of incident coronary heart disease (CHD) and provides predictive information beyond traditional risk factors. CAC scoring is reasonable to use in the decision to withhold, postpone, or initiate statin therapy in intermediate-risk or selected borderline-risk asymptomatic adults (age 72-75  years and LDL-C >=70 to <190 mg/dL) who do not have diabetes or established atherosclerotic cardiovascular disease (ASCVD).* In intermediate-risk (10-year ASCVD risk >=7.5% to <20%) adults or selected borderline-risk (10-year ASCVD risk >=5% to <7.5%) adults in whom a CAC score is measured for the purpose of making a treatment decision the following recommendations have been made:   If CAC=0, it is reasonable to withhold statin therapy and reassess in 5 to 10 years, as long as higher risk conditions are absent (diabetes mellitus, family history of premature CHD in first degree relatives (males <55 years; females <65 years), cigarette smoking, or LDL >=190 mg/dL).   If CAC is 1 to 99, it is reasonable to initiate statin therapy for patients >=59 years of age.   If CAC is >=100 or >=75th percentile, it is reasonable to initiate statin therapy at any age.   Cardiology referral should be considered for patients with CAC scores >=400 or >=75th percentile.   *2018 AHA/ACC/AACVPR/AAPA/ABC/ACPM/ADA/AGS/APhA/ASPC/NLA/PCNA Guideline on the Management of Blood Cholesterol: A Report of the American College of Cardiology/American Heart Association Task Force on Clinical Practice Guidelines. J Am Coll Cardiol.  2019;73(24):3168-3209.   

## 2020-10-09 NOTE — Progress Notes (Signed)
Subjective:  Patient ID: Blake Khan, male    DOB: [birth year]  Age: 60 y.o. MRN: [id number]  CC: Annual Exam   HPI Blake Khan presents for a well exam  Outpatient Medications Prior to Visit  Medication Sig Dispense Refill   Cholecalciferol (VITAMIN D3) 50 MCG (2000 UT) capsule Take 1 capsule (2,000 Units total) by mouth daily. 100 capsule 3   Cyanocobalamin (VITAMIN B-12) 1000 MCG SUBL Place 1 tablet (1,000 mcg total) under the tongue daily. 100 tablet 3   losartan (COZAAR) 50 MG tablet TAKE 1 TABLET BY MOUTH ONCE A DAY 90 tablet 1   pantoprazole (PROTONIX) 40 MG tablet TAKE 1 TABLET BY MOUTH ONCE A DAY 90 tablet 1   Cholecalciferol (VITAMIN D3) 1.25 MG (50000 UT) CAPS TAKE 1 CAPSULE BY MOUTH ONCE A WEEK. (Patient not taking: Reported on 10/09/2020) 6 capsule 0   COVID-19 mRNA Vac-TriS, Pfizer, (PFIZER-BIONT COVID-19 VAC-TRIS) SUSP injection Inject into the muscle. (Patient not taking: Reported on 10/09/2020) 0.3 mL 0   predniSONE (STERAPRED UNI-PAK 21 TAB) 5 MG (21) TBPK tablet Take as directed per package instructions for 6 days (Patient not taking: Reported on 10/09/2020) 21 tablet 0   No facility-administered medications prior to visit.    ROS: Review of Systems  Constitutional:  Negative for appetite change, fatigue and unexpected weight change.  HENT:  Negative for congestion, nosebleeds, sneezing, sore throat and trouble swallowing.   Eyes:  Negative for itching and visual disturbance.  Respiratory:  Negative for cough.   Cardiovascular:  Negative for chest pain, palpitations and leg swelling.  Gastrointestinal:  Negative for abdominal distention, blood in stool, diarrhea and nausea.  Genitourinary:  Negative for frequency and hematuria.  Musculoskeletal:  Negative for back pain, gait problem, joint swelling and neck pain.  Skin:  Negative for rash.  Neurological:  Negative for dizziness, tremors, speech difficulty and weakness.  Psychiatric/Behavioral:  Negative for  agitation, dysphoric mood, sleep disturbance and suicidal ideas. The patient is not nervous/anxious.    Objective:  BP (!) 141/80 (BP Location: Left Arm)   Pulse 67   Temp 98.1 F (36.7 C) (Oral)   Ht 5' 10.5" (1.791 m)   Wt 216 lb (98 kg)   SpO2 97%   BMI 30.55 kg/m   BP Readings from Last 3 Encounters:  10/09/20 (!) 141/80  10/09/19 (!) 150/82  08/21/19 (!) 148/90    Wt Readings from Last 3 Encounters:  10/09/20 216 lb (98 kg)  10/09/19 214 lb (97.1 kg)  08/21/19 215 lb 3.2 oz (97.6 kg)    Physical Exam Constitutional:      General: He is not in acute distress.    Appearance: He is well-developed.     Comments: NAD  Eyes:     Conjunctiva/sclera: Conjunctivae normal.     Pupils: Pupils are equal, round, and reactive to light.  Neck:     Thyroid: No thyromegaly.     Vascular: No JVD.  Cardiovascular:     Rate and Rhythm: Normal rate and regular rhythm.     Heart sounds: Normal heart sounds. No murmur heard.   No friction rub. No gallop.  Pulmonary:     Effort: Pulmonary effort is normal. No respiratory distress.     Breath sounds: Normal breath sounds. No wheezing or rales.  Chest:     Chest wall: No tenderness.  Abdominal:     General: Bowel sounds are normal. There is no distension.     Palpations:  Abdomen is soft. There is no mass.     Tenderness: There is no abdominal tenderness. There is no guarding or rebound.  Genitourinary:    Prostate: Normal.     Rectum: Normal. Guaiac result negative.  Musculoskeletal:        General: No tenderness. Normal range of motion.     Cervical back: Normal range of motion.  Lymphadenopathy:     Cervical: No cervical adenopathy.  Skin:    General: Skin is warm and dry.     Findings: No rash.  Neurological:     Mental Status: He is alert and oriented to person, place, and time.     Cranial Nerves: No cranial nerve deficit.     Motor: No abnormal muscle tone.     Coordination: Coordination normal.     Gait: Gait  normal.     Deep Tendon Reflexes: Reflexes are normal and symmetric.  Psychiatric:        Behavior: Behavior normal.        Thought Content: Thought content normal.        Judgment: Judgment normal.    Lab Results  Component Value Date   WBC 6.9 09/30/2020   HGB 15.1 09/30/2020   HCT 44.3 09/30/2020   PLT 274.0 09/30/2020   GLUCOSE 94 09/30/2020   CHOL 176 09/30/2020   TRIG 120.0 09/30/2020   HDL 42.30 09/30/2020   LDLCALC 109 (H) 09/30/2020   ALT 21 09/30/2020   AST 20 09/30/2020   NA 141 09/30/2020   K 3.8 09/30/2020   CL 103 09/30/2020   CREATININE 1.20 09/30/2020   BUN 24 (H) 09/30/2020   CO2 25 09/30/2020   TSH 2.57 09/30/2020   PSA 1.90 09/30/2020    US Renal  Result Date: 10/26/2019 CLINICAL DATA:  Slightly decreased GFR. EXAM: RENAL / URINARY TRACT ULTRASOUND COMPLETE COMPARISON:  None. FINDINGS: Right Kidney: Renal measurements: 11.6 x 5.4 x 5.7 cm = volume: 186 mL. Echogenicity within normal limits. No mass or hydronephrosis visualized. Left Kidney: Renal measurements: 10.1 x 6.0 x 5.1 cm = volume: 162 mL. Echogenicity within normal limits. No mass or hydronephrosis visualized. Bladder: Appears normal for degree of bladder distention. Other: Enlarged prostate with a volume of 107 cc. IMPRESSION: 1. The kidneys are normal in appearance. 2. The prostate is enlarged and exerts mass effect on the posterior bladder. Electronically Signed   By: Dorise Bullion III M.D   On: 10/26/2019 18:23    Assessment & Plan:      Walker Kehr, MD

## 2020-10-09 NOTE — Assessment & Plan Note (Addendum)
Elevated blood pressure.  Our  goal SBP<130.  A couple years ago Blake Khan passed out in the morning after a night of call.  It was attributed to taking 100 mg of losartan in addition to other factors.  Losartan was decreased to 50 mg a day. Dr. Malachy Mood will monitor his blood pressure at home and let me know if it is elevated. RTC 6 months

## 2020-10-09 NOTE — Assessment & Plan Note (Addendum)
  We discussed age appropriate health related issues, including available/recomended screening tests and vaccinations. Labs were ordered to be later reviewed . All questions were answered. We discussed one or more of the following - seat belt use, use of sunscreen/sun exposure exercise, safe sex, fall risk reduction, second hand smoke exposure, firearm use and storage, seat belt use, a need for adhering to healthy diet and exercise. Labs were ordered.  All questions were answered. Colon 2017 Dr Earlean Shawl Cor calcium CT offered 9/21 tDAP 2017Hep C(-) 2018 PSA 1.1 2020 Brad declined Shingrix

## 2020-10-09 NOTE — Assessment & Plan Note (Signed)
Cor calc CT offered 9/21

## 2020-10-13 DIAGNOSIS — M5416 Radiculopathy, lumbar region: Secondary | ICD-10-CM | POA: Diagnosis not present

## 2020-11-18 DIAGNOSIS — L820 Inflamed seborrheic keratosis: Secondary | ICD-10-CM | POA: Diagnosis not present

## 2020-12-08 IMAGING — US US RENAL
1 series · 14 of 25 positions shown · non-contrast
Comparison: None.

CLINICAL DATA: Slightly decreased GFR.

EXAM:
RENAL / URINARY TRACT ULTRASOUND COMPLETE

[Series 1: us renal · 0.25mm/px · 14 of 35 slices shown]
[im 1/35]
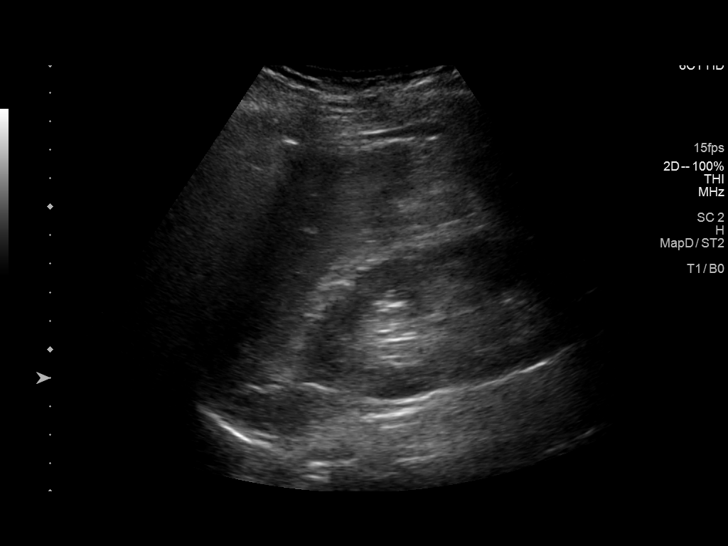
[im 3/35]
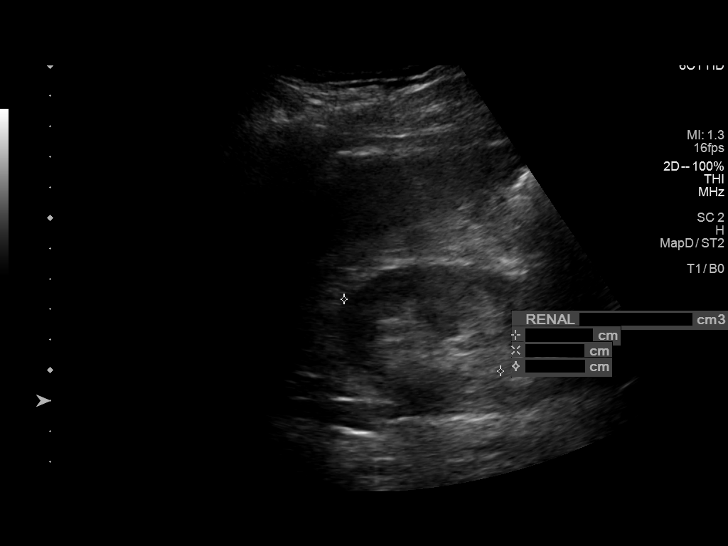
[im 6/35]
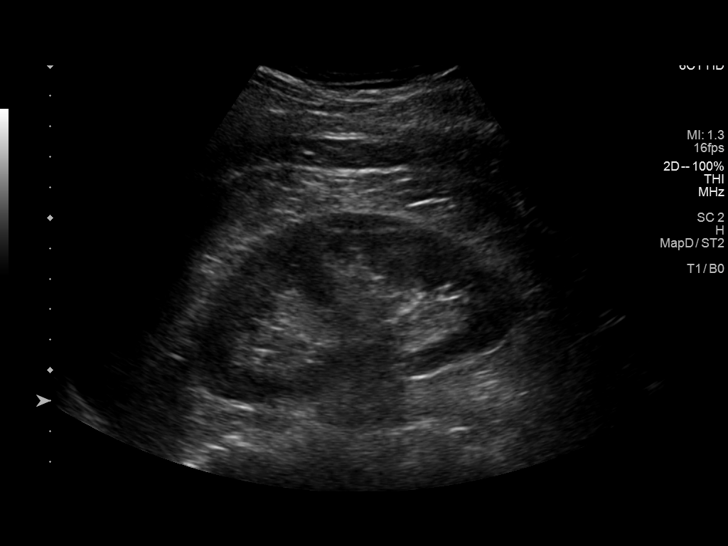
[im 9/35]
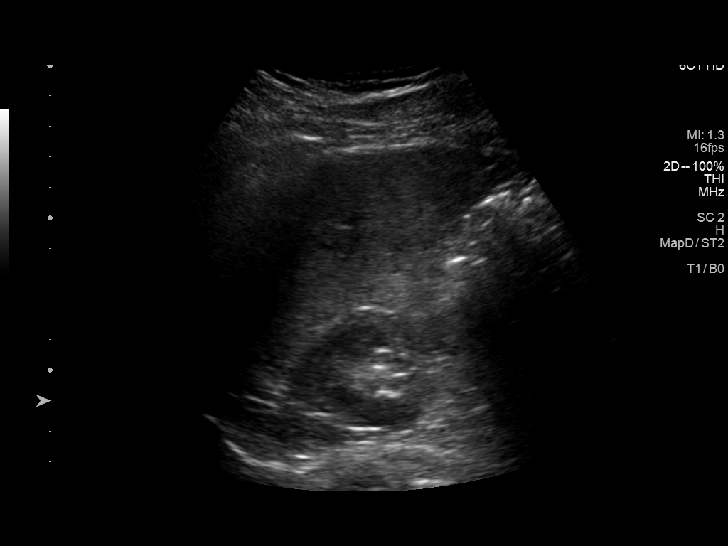
[im 12/35]
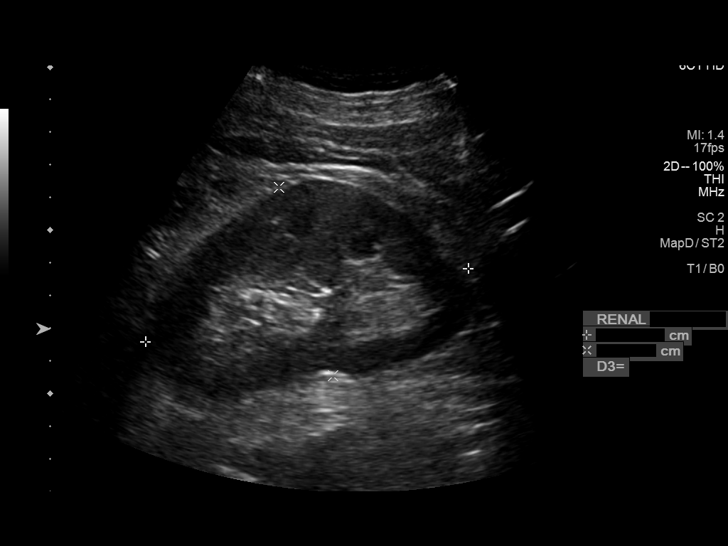
[im 13/35]
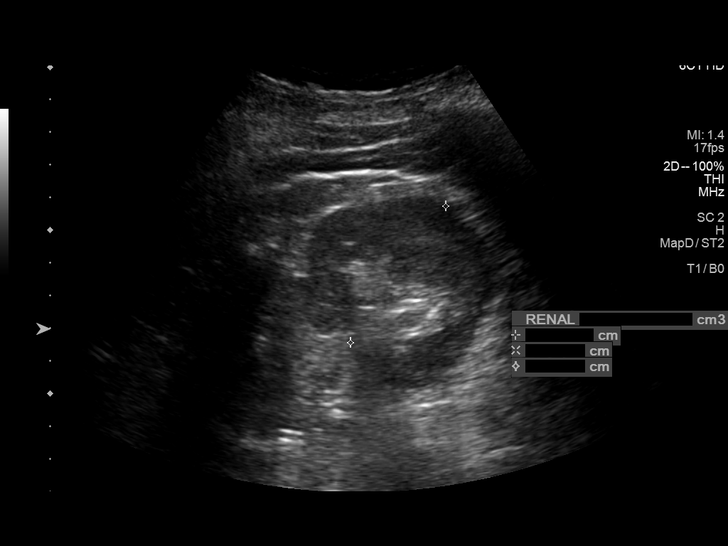
[im 16/35]
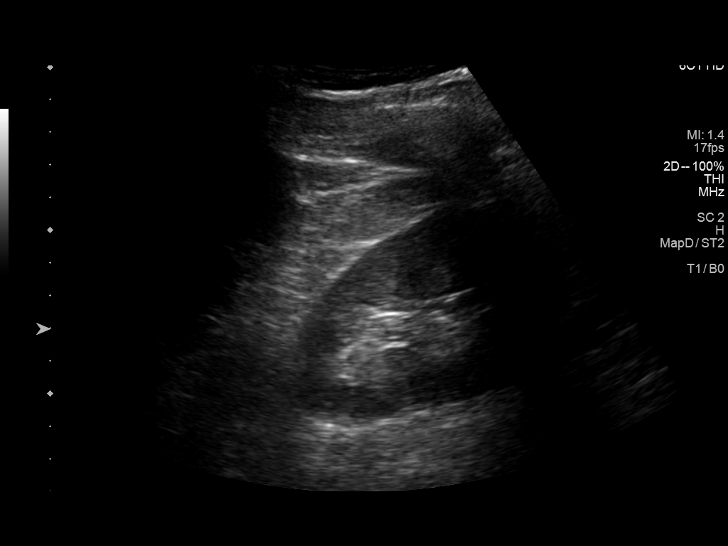
[im 19/35]
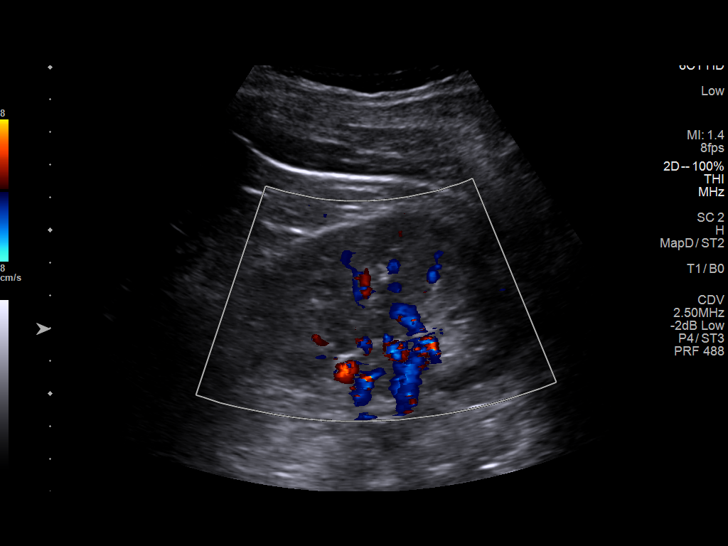
[im 22/35]
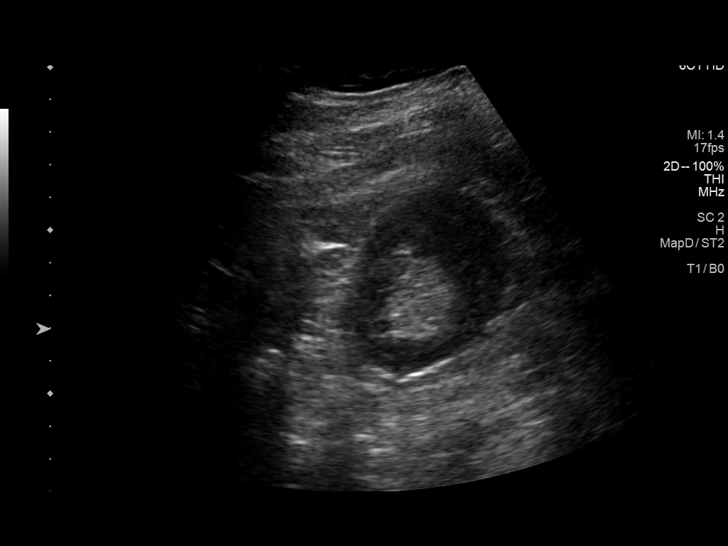
[im 23/35]
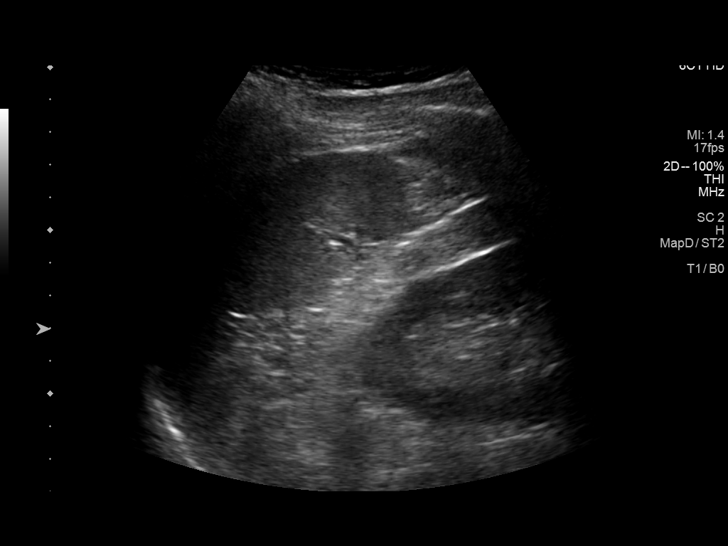
[im 26/35]
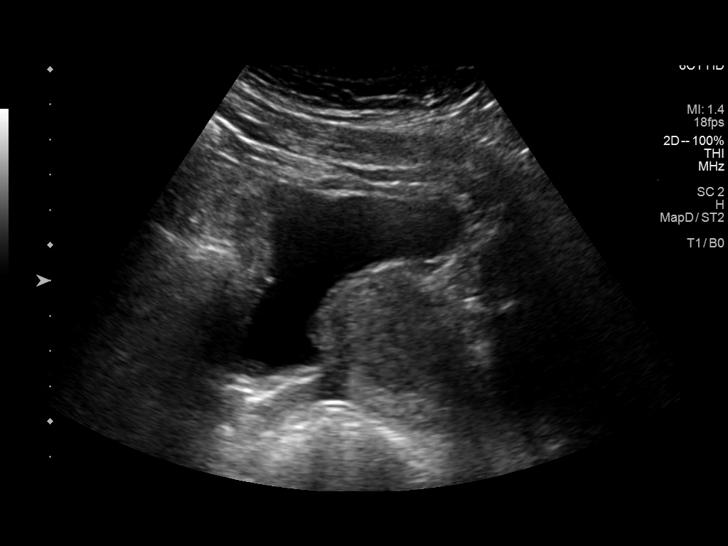
[im 29/35]
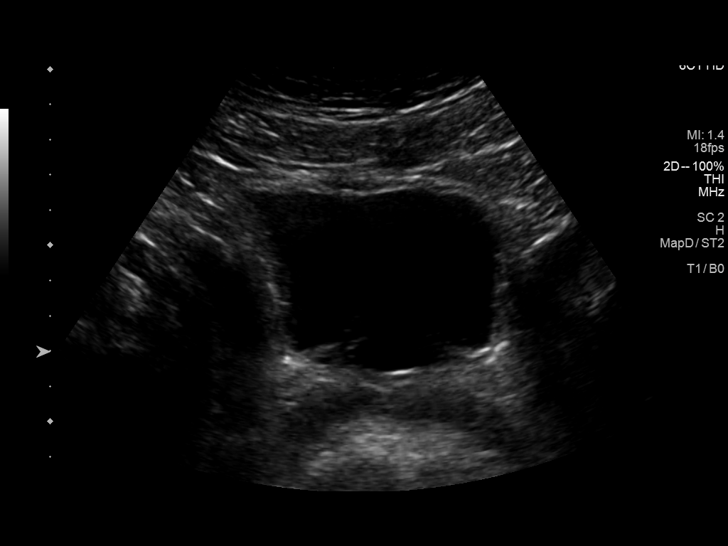
[im 32/35]
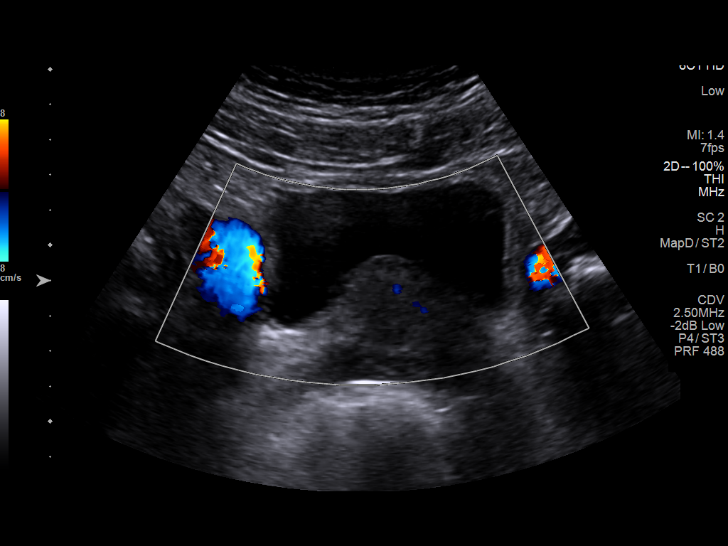
[im 35/35]
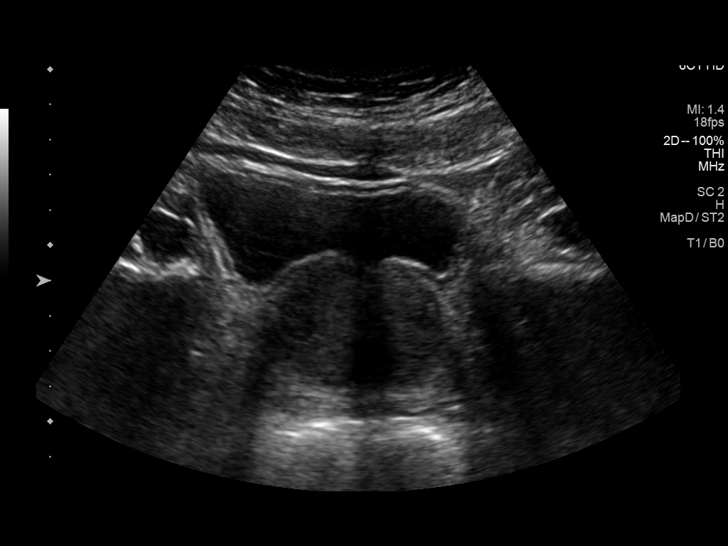

[14 of 25 positions shown; findings below may reference images not displayed]

FINDINGS: Right Kidney:

Renal measurements: 11.6 x 5.4 x 5.7 cm = volume: 186 mL.
Echogenicity within normal limits. No mass or hydronephrosis
visualized.

Left Kidney:

Renal measurements: 10.1 x 6.0 x 5.1 cm = volume: 162 mL.
Echogenicity within normal limits. No mass or hydronephrosis
visualized.

Bladder:

Appears normal for degree of bladder distention.

Other:

Enlarged prostate with a volume of 107 cc.
IMPRESSION: 1. The kidneys are normal in appearance.
2. The prostate is enlarged and exerts mass effect on the posterior
bladder.

## 2021-01-12 ENCOUNTER — Other Ambulatory Visit (HOSPITAL_COMMUNITY): Payer: Self-pay

## 2021-03-17 DIAGNOSIS — H5203 Hypermetropia, bilateral: Secondary | ICD-10-CM | POA: Diagnosis not present

## 2021-04-07 DIAGNOSIS — M5412 Radiculopathy, cervical region: Secondary | ICD-10-CM | POA: Diagnosis not present

## 2021-04-13 DIAGNOSIS — M5412 Radiculopathy, cervical region: Secondary | ICD-10-CM | POA: Diagnosis not present

## 2021-04-15 ENCOUNTER — Other Ambulatory Visit (HOSPITAL_COMMUNITY): Payer: Self-pay

## 2021-04-19 DIAGNOSIS — M5412 Radiculopathy, cervical region: Secondary | ICD-10-CM | POA: Diagnosis not present

## 2021-04-26 DIAGNOSIS — M5412 Radiculopathy, cervical region: Secondary | ICD-10-CM | POA: Diagnosis not present

## 2021-07-21 ENCOUNTER — Other Ambulatory Visit (HOSPITAL_COMMUNITY): Payer: Self-pay

## 2021-10-14 ENCOUNTER — Encounter: Payer: 59 | Admitting: Internal Medicine

## 2021-10-18 ENCOUNTER — Other Ambulatory Visit: Payer: Self-pay | Admitting: Internal Medicine

## 2021-10-18 DIAGNOSIS — E559 Vitamin D deficiency, unspecified: Secondary | ICD-10-CM

## 2021-10-18 DIAGNOSIS — E538 Deficiency of other specified B group vitamins: Secondary | ICD-10-CM

## 2021-10-18 DIAGNOSIS — Z0000 Encounter for general adult medical examination without abnormal findings: Secondary | ICD-10-CM

## 2021-10-20 ENCOUNTER — Other Ambulatory Visit (INDEPENDENT_AMBULATORY_CARE_PROVIDER_SITE_OTHER): Payer: 59

## 2021-10-20 DIAGNOSIS — E538 Deficiency of other specified B group vitamins: Secondary | ICD-10-CM | POA: Diagnosis not present

## 2021-10-20 DIAGNOSIS — Z0000 Encounter for general adult medical examination without abnormal findings: Secondary | ICD-10-CM

## 2021-10-20 DIAGNOSIS — E559 Vitamin D deficiency, unspecified: Secondary | ICD-10-CM | POA: Diagnosis not present

## 2021-10-20 LAB — CBC WITH DIFFERENTIAL/PLATELET
Basophils Absolute: 0.1 10*3/uL (ref 0.0–0.1)
Basophils Relative: 1 % (ref 0.0–3.0)
Eosinophils Absolute: 0.2 10*3/uL (ref 0.0–0.7)
Eosinophils Relative: 2.7 % (ref 0.0–5.0)
HCT: 44.1 % (ref 39.0–52.0)
Hemoglobin: 15.4 g/dL (ref 13.0–17.0)
Lymphocytes Relative: 42.4 % (ref 12.0–46.0)
Lymphs Abs: 2.4 10*3/uL (ref 0.7–4.0)
MCHC: 34.8 g/dL (ref 30.0–36.0)
MCV: 86.5 fl (ref 78.0–100.0)
Monocytes Absolute: 0.5 10*3/uL (ref 0.1–1.0)
Monocytes Relative: 9 % (ref 3.0–12.0)
Neutro Abs: 2.6 10*3/uL (ref 1.4–7.7)
Neutrophils Relative %: 44.9 % (ref 43.0–77.0)
Platelets: 242 10*3/uL (ref 150.0–400.0)
RBC: 5.1 Mil/uL (ref 4.22–5.81)
RDW: 13.1 % (ref 11.5–15.5)
WBC: 5.7 10*3/uL (ref 4.0–10.5)

## 2021-10-20 LAB — URINALYSIS
Bilirubin Urine: NEGATIVE
Hgb urine dipstick: NEGATIVE
Ketones, ur: NEGATIVE
Leukocytes,Ua: NEGATIVE
Nitrite: NEGATIVE
Specific Gravity, Urine: <=1.005 — AB (ref 1.000–1.030)
Total Protein, Urine: NEGATIVE
Urine Glucose: NEGATIVE
Urobilinogen, UA: 0.2 (ref 0.0–1.0)
pH: 6 (ref 5.0–8.0)

## 2021-10-20 LAB — COMPREHENSIVE METABOLIC PANEL
ALT: 19 U/L (ref 0–53)
AST: 20 U/L (ref 0–37)
Albumin: 4.7 g/dL (ref 3.5–5.2)
Alkaline Phosphatase: 63 U/L (ref 39–117)
BUN: 25 mg/dL — ABNORMAL HIGH (ref 6–23)
CO2: 26 mEq/L (ref 19–32)
Calcium: 9.7 mg/dL (ref 8.4–10.5)
Chloride: 100 mEq/L (ref 96–112)
Creatinine, Ser: 1.18 mg/dL (ref 0.40–1.50)
GFR: 66.85 mL/min (ref >60.00)
Glucose, Bld: 124 mg/dL — ABNORMAL HIGH (ref 70–99)
Potassium: 4.2 mEq/L (ref 3.5–5.1)
Sodium: 137 mEq/L (ref 135–145)
Total Bilirubin: 0.7 mg/dL (ref 0.2–1.2)
Total Protein: 7.5 g/dL (ref 6.0–8.3)

## 2021-10-20 LAB — LIPID PANEL
Cholesterol: 170 mg/dL (ref 0–200)
HDL: 40.1 mg/dL (ref >39.00)
LDL Cholesterol: 112 mg/dL — ABNORMAL HIGH (ref 0–99)
NonHDL: 129.53
Total CHOL/HDL Ratio: 4
Triglycerides: 90 mg/dL (ref 0.0–149.0)
VLDL: 18 mg/dL (ref 0.0–40.0)

## 2021-10-20 LAB — PSA: PSA: 2.94 ng/mL (ref 0.10–4.00)

## 2021-10-20 LAB — VITAMIN D 25 HYDROXY (VIT D DEFICIENCY, FRACTURES): VITD: 26.29 ng/mL — ABNORMAL LOW (ref 30.00–100.00)

## 2021-10-20 LAB — TSH: TSH: 2 u[IU]/mL (ref 0.35–5.50)

## 2021-10-20 LAB — VITAMIN B12: Vitamin B-12: 707 pg/mL (ref 211–911)

## 2021-10-28 ENCOUNTER — Other Ambulatory Visit: Payer: Self-pay | Admitting: Internal Medicine

## 2021-10-29 ENCOUNTER — Other Ambulatory Visit (HOSPITAL_COMMUNITY): Payer: Self-pay

## 2021-10-29 MED ORDER — PANTOPRAZOLE SODIUM 40 MG PO TBEC
40.0000 mg | DELAYED_RELEASE_TABLET | Freq: Every day | ORAL | 0 refills | Status: DC
  Filled 2021-10-29: qty 30, 30d supply, fill #0

## 2021-10-29 MED ORDER — LOSARTAN POTASSIUM 50 MG PO TABS
50.0000 mg | ORAL_TABLET | Freq: Every day | ORAL | 0 refills | Status: DC
  Filled 2021-10-29: qty 30, 30d supply, fill #0

## 2021-11-01 ENCOUNTER — Ambulatory Visit (INDEPENDENT_AMBULATORY_CARE_PROVIDER_SITE_OTHER): Payer: 59 | Admitting: Internal Medicine

## 2021-11-01 ENCOUNTER — Encounter: Payer: Self-pay | Admitting: Internal Medicine

## 2021-11-01 VITALS — BP 138/72 | HR 66 | Temp 98.0°F | Ht 73.5 in | Wt 213.4 lb

## 2021-11-01 DIAGNOSIS — R55 Syncope and collapse: Secondary | ICD-10-CM | POA: Diagnosis not present

## 2021-11-01 DIAGNOSIS — N320 Bladder-neck obstruction: Secondary | ICD-10-CM | POA: Insufficient documentation

## 2021-11-01 DIAGNOSIS — Z0000 Encounter for general adult medical examination without abnormal findings: Secondary | ICD-10-CM

## 2021-11-01 DIAGNOSIS — I10 Essential (primary) hypertension: Secondary | ICD-10-CM | POA: Diagnosis not present

## 2021-11-01 DIAGNOSIS — E559 Vitamin D deficiency, unspecified: Secondary | ICD-10-CM | POA: Diagnosis not present

## 2021-11-01 DIAGNOSIS — R739 Hyperglycemia, unspecified: Secondary | ICD-10-CM | POA: Diagnosis not present

## 2021-11-01 DIAGNOSIS — E538 Deficiency of other specified B group vitamins: Secondary | ICD-10-CM | POA: Diagnosis not present

## 2021-11-01 MED ORDER — VITAMIN D3 50 MCG (2000 UT) PO CAPS: 2000.0000 [IU] | ORAL_CAPSULE | Freq: Every day | ORAL | 3 refills | Status: AC

## 2021-11-01 NOTE — Progress Notes (Addendum)
Subjective:  Patient ID: Blake Khan, male    DOB: 18-Jan-1960  Age: 61 y.o. MRN: [id number]  CC: Annual Exam   HPI ASHUTOSH DIEGUEZ presents for a well exam.  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  He has been able to bike once a week C/o BPH sx's -mild.  No syncope.  He stopped taking vitamin D a year ago.  Outpatient Medications Prior to Visit  Medication Sig Dispense Refill   Cyanocobalamin (VITAMIN B-12) 1000 MCG SUBL Place 1 tablet (1,000 mcg total) under the tongue daily. 100 tablet 3   losartan (COZAAR) 50 MG tablet Take 1 tablet (50 mg total) by mouth daily. Annual appt is due must see provider for future refills 30 tablet 0   pantoprazole (PROTONIX) 40 MG tablet Take 1 tablet (40 mg total) by mouth daily. Annual appt is due must see provider for future refills 30 tablet 0   Cholecalciferol (VITAMIN D3) 50 MCG (2000 UT) capsule Take 1 capsule (2,000 Units total) by mouth daily. 100 capsule 3   No facility-administered medications prior to visit.    ROS: Review of Systems  Constitutional:  Positive for fatigue. Negative for appetite change and unexpected weight change.  HENT:  Negative for congestion, nosebleeds, sneezing, sore throat and trouble swallowing.   Eyes:  Negative for itching and visual disturbance.  Respiratory:  Negative for cough.   Cardiovascular:  Negative for chest pain, palpitations and leg swelling.  Gastrointestinal:  Negative for abdominal distention, blood in stool, diarrhea and nausea.  Genitourinary:  Positive for frequency and urgency. Negative for hematuria.  Musculoskeletal:  Negative for back pain, gait problem, joint swelling and neck pain.  Skin:  Negative for rash.  Neurological:  Negative for dizziness, tremors, speech difficulty and weakness.  Psychiatric/Behavioral:  Negative for agitation, dysphoric mood, sleep disturbance and suicidal ideas. The patient is not nervous/anxious.      Objective:  BP 138/72 (BP Location: Left Arm)   Pulse 66   Temp 98 F (36.7 C) (Oral)   Ht 5' 10.5" (1.791 m)   Wt 213 lb 6.4 oz (96.8 kg)   SpO2 97%   BMI 30.19 kg/m   BP Readings from Last 3 Encounters:  11/01/21 138/72  10/09/20 (!) 141/80  10/09/19 (!) 150/82    Wt Readings from Last 3 Encounters:  11/01/21 213 lb 6.4 oz (96.8 kg)  10/09/20 216 lb (98 kg)  10/09/19 214 lb (97.1 kg)    Physical Exam Constitutional:      General: He is not in acute distress.    Appearance: He is well-developed. He is obese.     Comments: NAD  Eyes:     Conjunctiva/sclera: Conjunctivae normal.     Pupils: Pupils are equal, round, and reactive to light.  Neck:     Thyroid: No thyromegaly.     Vascular: No JVD.  Cardiovascular:     Rate and Rhythm: Normal rate and regular rhythm.     Heart sounds: Normal heart sounds. No murmur heard.    No friction rub. No gallop.  Pulmonary:     Effort: Pulmonary effort is normal. No respiratory distress.     Breath sounds: Normal breath sounds. No wheezing or rales.  Chest:     Chest wall: No tenderness.  Abdominal:     General: Bowel sounds are normal. There is no distension.     Palpations: Abdomen is soft. There is  no mass.     Tenderness: There is no abdominal tenderness. There is no guarding or rebound.  Genitourinary:    Rectum: Normal. Guaiac result negative.  Musculoskeletal:        General: No tenderness. Normal range of motion.     Cervical back: Normal range of motion.  Lymphadenopathy:     Cervical: No cervical adenopathy.  Skin:    General: Skin is warm and dry.     Findings: No rash.  Neurological:     Mental Status: He is alert and oriented to person, place, and time.     Cranial Nerves: No cranial nerve deficit.     Motor: No abnormal muscle tone.     Coordination: Coordination normal.     Gait: Gait normal.     Deep Tendon Reflexes: Reflexes are normal and symmetric.  Psychiatric:        Behavior: Behavior  normal.        Thought Content: Thought content normal.        Judgment: Judgment normal.   Prostate 1+, NT  Lab Results  Component Value Date   WBC 5.7 10/20/2021   HGB 15.4 10/20/2021   HCT 44.1 10/20/2021   PLT 242.0 10/20/2021   GLUCOSE 124 (H) 10/20/2021   CHOL 170 10/20/2021   TRIG 90.0 10/20/2021   HDL 40.10 10/20/2021   LDLCALC 112 (H) 10/20/2021   ALT 19 10/20/2021   AST 20 10/20/2021   NA 137 10/20/2021   K 4.2 10/20/2021   CL 100 10/20/2021   CREATININE 1.18 10/20/2021   BUN 25 (H) 10/20/2021   CO2 26 10/20/2021   TSH 2.00 10/20/2021   PSA 2.94 10/20/2021    US Renal  Result Date: 10/26/2019 CLINICAL DATA:  Slightly decreased GFR. EXAM: RENAL / URINARY TRACT ULTRASOUND COMPLETE COMPARISON:  None. FINDINGS: Right Kidney: Renal measurements: 11.6 x 5.4 x 5.7 cm = volume: 186 mL. Echogenicity within normal limits. No mass or hydronephrosis visualized. Left Kidney: Renal measurements: 10.1 x 6.0 x 5.1 cm = volume: 162 mL. Echogenicity within normal limits. No mass or hydronephrosis visualized. Bladder: Appears normal for degree of bladder distention. Other: Enlarged prostate with a volume of 107 cc. IMPRESSION: 1. The kidneys are normal in appearance. 2. The prostate is enlarged and exerts mass effect on the posterior bladder. Electronically Signed   By: Gerome Sam III M.D   On: 10/26/2019 18:23    Assessment & Plan:   Problem List Items Addressed This Visit     B12 deficiency    Continue with vitamin B12      Bladder neck obstruction    Mild symptoms.  We discussed options.  In the view of Brad's history of syncope would not use Cardura or Hytrin.  We discussed our options to use finasteride and/or Flomax.  Not interested in the moment due to mild nature of the symptoms.      HTN (hypertension)    Continue losartan      Hyperglycemia    Discussed.  Given Dexcom 7 sample sensor. Return to clinic in 6 months with A1c Weight loss, exercise, stress  reduction suggested      Relevant Orders   Comprehensive metabolic panel   VITAMIN D 25 Hydroxy (Vit-D Deficiency, Fractures)   Hemoglobin A1c   Syncope    No relapse      Vitamin D deficiency    Not taking vitamin D for 12 months.  Restart vitamin D  Relevant Orders   VITAMIN D 25 Hydroxy (Vit-D Deficiency, Fractures)   Well adult exam - Primary     We discussed age appropriate health related issues, including available/recomended screening tests and vaccinations. Labs were ordered to be later reviewed . All questions were answered. We discussed one or more of the following - seat belt use, use of sunscreen/sun exposure exercise, safe sex, fall risk reduction, second hand smoke exposure, firearm use and storage, seat belt use, a need for adhering to healthy diet and exercise. Labs were ordered.  All questions were answered. Leroy Sea is a Proofreader. Colon 2017 Dr Earlean Shawl, due in 2027 Cor calcium CT offered 9/21, 9/22 tDAP 2017Hep C(-) 2018 PSA 1.1 2020 Brad declined Shingrx  Cologuard test was offered. I suggested that Wilfred Lacy comes back on his work hours and takes more vacation due to health reasons.      Relevant Orders   Comprehensive metabolic panel   VITAMIN D 25 Hydroxy (Vit-D Deficiency, Fractures)   Hemoglobin A1c      Meds ordered this encounter  Medications   Cholecalciferol (VITAMIN D3) 50 MCG (2000 UT) capsule    Sig: Take 1 capsule (2,000 Units total) by mouth daily.    Dispense:  100 capsule    Refill:  3      Follow-up: Return in about 6 months (around 05/03/2022) for a follow-up visit.  Walker Kehr, MD

## 2021-11-01 NOTE — Assessment & Plan Note (Signed)
Mild symptoms.  We discussed options.  In the view of Blake Khan's history of syncope would not use Cardura or Hytrin.  We discussed our options to use finasteride and/or Flomax.  Not interested in the moment due to mild nature of the symptoms.

## 2021-11-01 NOTE — Assessment & Plan Note (Signed)
No relapse 

## 2021-11-01 NOTE — Assessment & Plan Note (Addendum)
  We discussed age appropriate health related issues, including available/recomended screening tests and vaccinations. Labs were ordered to be later reviewed . All questions were answered. We discussed one or more of the following - seat belt use, use of sunscreen/sun exposure exercise, safe sex, fall risk reduction, second hand smoke exposure, firearm use and storage, seat belt use, a need for adhering to healthy diet and exercise. Labs were ordered.  All questions were answered. 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 I suggested that Blake Khan comes back on his work hours and takes more vacation due to health reasons.

## 2021-11-01 NOTE — Assessment & Plan Note (Signed)
Continue with vitamin B12 

## 2021-11-01 NOTE — Assessment & Plan Note (Signed)
Continue losartan. 

## 2021-11-01 NOTE — Assessment & Plan Note (Signed)
Not taking vitamin D for 12 months.  Restart vitamin D

## 2021-11-01 NOTE — Assessment & Plan Note (Signed)
Discussed.  Given Dexcom 7 sample sensor. Return to clinic in 6 months with A1c Weight loss, exercise, stress reduction suggested

## 2021-11-03 ENCOUNTER — Encounter: Payer: Self-pay | Admitting: Internal Medicine

## 2021-11-04 ENCOUNTER — Encounter: Payer: Self-pay | Admitting: Internal Medicine

## 2021-11-26 ENCOUNTER — Other Ambulatory Visit: Payer: Self-pay | Admitting: Internal Medicine

## 2021-11-26 ENCOUNTER — Other Ambulatory Visit (HOSPITAL_COMMUNITY): Payer: Self-pay

## 2021-11-26 MED ORDER — PANTOPRAZOLE SODIUM 40 MG PO TBEC
40.0000 mg | DELAYED_RELEASE_TABLET | Freq: Every day | ORAL | 3 refills | Status: DC
  Filled 2021-11-26: qty 90, 90d supply, fill #0
  Filled 2022-02-25: qty 90, 90d supply, fill #1
  Filled 2022-05-30: qty 90, 90d supply, fill #2
  Filled 2022-09-13: qty 90, 90d supply, fill #3

## 2021-11-26 MED ORDER — LOSARTAN POTASSIUM 50 MG PO TABS
50.0000 mg | ORAL_TABLET | Freq: Every day | ORAL | 3 refills | Status: DC
  Filled 2021-11-26: qty 90, 90d supply, fill #0
  Filled 2022-02-25: qty 90, 90d supply, fill #1

## 2022-02-25 ENCOUNTER — Other Ambulatory Visit (HOSPITAL_COMMUNITY): Payer: Self-pay

## 2022-02-25 ENCOUNTER — Other Ambulatory Visit: Payer: Self-pay

## 2022-03-01 ENCOUNTER — Encounter (HOSPITAL_COMMUNITY): Payer: Self-pay

## 2022-03-01 ENCOUNTER — Other Ambulatory Visit (HOSPITAL_COMMUNITY): Payer: Self-pay

## 2022-03-01 ENCOUNTER — Other Ambulatory Visit: Payer: Self-pay

## 2022-05-03 ENCOUNTER — Other Ambulatory Visit: Payer: Self-pay

## 2022-05-03 ENCOUNTER — Other Ambulatory Visit (HOSPITAL_COMMUNITY): Payer: Self-pay

## 2022-05-03 ENCOUNTER — Ambulatory Visit: Payer: 59 | Admitting: Internal Medicine

## 2022-05-03 ENCOUNTER — Encounter: Payer: Self-pay | Admitting: Internal Medicine

## 2022-05-03 VITALS — BP 140/85 | HR 72 | Wt 218.0 lb

## 2022-05-03 DIAGNOSIS — K219 Gastro-esophageal reflux disease without esophagitis: Secondary | ICD-10-CM | POA: Diagnosis not present

## 2022-05-03 DIAGNOSIS — E785 Hyperlipidemia, unspecified: Secondary | ICD-10-CM | POA: Diagnosis not present

## 2022-05-03 DIAGNOSIS — E538 Deficiency of other specified B group vitamins: Secondary | ICD-10-CM | POA: Diagnosis not present

## 2022-05-03 DIAGNOSIS — R739 Hyperglycemia, unspecified: Secondary | ICD-10-CM | POA: Diagnosis not present

## 2022-05-03 DIAGNOSIS — I10 Essential (primary) hypertension: Secondary | ICD-10-CM

## 2022-05-03 DIAGNOSIS — E559 Vitamin D deficiency, unspecified: Secondary | ICD-10-CM

## 2022-05-03 MED ORDER — LOSARTAN POTASSIUM 50 MG PO TABS: 100.0000 mg | ORAL_TABLET | Freq: Every day | ORAL | 3 refills | Status: DC

## 2022-05-03 MED ORDER — LOSARTAN POTASSIUM 50 MG PO TABS
100.0000 mg | ORAL_TABLET | Freq: Every day | ORAL | 3 refills | Status: DC
  Filled 2022-05-03 – 2022-05-30 (×2): qty 180, 90d supply, fill #0
  Filled 2022-09-13: qty 180, 90d supply, fill #1

## 2022-05-03 NOTE — Patient Instructions (Signed)

## 2022-05-03 NOTE — Assessment & Plan Note (Signed)
Try Losartan 75 mg or 100 mg a day if BP is elevated

## 2022-05-03 NOTE — Assessment & Plan Note (Signed)
On Protonix 

## 2022-05-03 NOTE — Assessment & Plan Note (Signed)
Dexcom 7 sample  - CBGs<110 Return to clinic in 6 months with A1c Weight loss, stress reduction suggested again

## 2022-05-03 NOTE — Assessment & Plan Note (Signed)
Cont w/B12 

## 2022-05-03 NOTE — Assessment & Plan Note (Signed)
On vitamin D 

## 2022-05-03 NOTE — Progress Notes (Signed)
Subjective:  Patient ID: Blake Khan, male    DOB: 1960-09-27  Age: 62 y.o. MRN: [id number]  CC: No chief complaint on file.   HPI SUHEYB RAUCCI presents for HTN, elevated glucose, B12 def  Outpatient Medications Prior to Visit  Medication Sig Dispense Refill   Cholecalciferol (VITAMIN D3) 50 MCG (2000 UT) capsule Take 1 capsule (2,000 Units total) by mouth daily. 100 capsule 3   Cyanocobalamin (VITAMIN B-12) 1000 MCG SUBL Place 1 tablet (1,000 mcg total) under the tongue daily. 100 tablet 3   pantoprazole (PROTONIX) 40 MG tablet Take 1 tablet (40 mg total) by mouth daily. 90 tablet 3   losartan (COZAAR) 50 MG tablet Take 1 tablet (50 mg total) by mouth daily. 90 tablet 3   No facility-administered medications prior to visit.    ROS: Review of Systems  Constitutional:  Negative for appetite change, fatigue and unexpected weight change.  HENT:  Negative for congestion, nosebleeds, sneezing, sore throat and trouble swallowing.   Eyes:  Negative for itching and visual disturbance.  Respiratory:  Negative for cough.   Cardiovascular:  Negative for chest pain, palpitations and leg swelling.  Gastrointestinal:  Negative for abdominal distention, blood in stool, diarrhea and nausea.  Genitourinary:  Negative for frequency and hematuria.  Musculoskeletal:  Negative for back pain, gait problem, joint swelling and neck pain.  Skin:  Negative for rash.  Neurological:  Negative for dizziness, tremors, speech difficulty and weakness.  Psychiatric/Behavioral:  Negative for agitation, dysphoric mood and sleep disturbance. The patient is not nervous/anxious.     Objective:  BP (!) 140/85   Pulse 72   Wt 218 lb (98.9 kg)   BMI 28.37 kg/m   BP Readings from Last 3 Encounters:  05/03/22 (!) 140/85  11/01/21 138/72  10/09/20 (!) 141/80    Wt Readings from Last 3 Encounters:  05/03/22 218 lb (98.9 kg)  11/01/21 213 lb 6.4 oz (96.8 kg)  10/09/20 216 lb (98 kg)    Physical  Exam Constitutional:      General: He is not in acute distress.    Appearance: Normal appearance. He is well-developed.     Comments: NAD  Eyes:     Conjunctiva/sclera: Conjunctivae normal.     Pupils: Pupils are equal, round, and reactive to light.  Neck:     Thyroid: No thyromegaly.     Vascular: No JVD.  Cardiovascular:     Rate and Rhythm: Normal rate and regular rhythm.     Heart sounds: Normal heart sounds. No murmur heard.    No friction rub. No gallop.  Pulmonary:     Effort: Pulmonary effort is normal. No respiratory distress.     Breath sounds: Normal breath sounds. No wheezing or rales.  Chest:     Chest wall: No tenderness.  Abdominal:     General: Bowel sounds are normal. There is no distension.     Palpations: Abdomen is soft. There is no mass.     Tenderness: There is no abdominal tenderness. There is no guarding or rebound.  Musculoskeletal:        General: No tenderness. Normal range of motion.     Cervical back: Normal range of motion.  Lymphadenopathy:     Cervical: No cervical adenopathy.  Skin:    General: Skin is warm and dry.     Findings: No rash.  Neurological:     Mental Status: He is alert and oriented to person, place, and time.  Cranial Nerves: No cranial nerve deficit.     Motor: No abnormal muscle tone.     Coordination: Coordination normal.     Gait: Gait normal.     Deep Tendon Reflexes: Reflexes are normal and symmetric.  Psychiatric:        Behavior: Behavior normal.        Thought Content: Thought content normal.        Judgment: Judgment normal.     Lab Results  Component Value Date   WBC 5.7 10/20/2021   HGB 15.4 10/20/2021   HCT 44.1 10/20/2021   PLT 242.0 10/20/2021   GLUCOSE 124 (H) 10/20/2021   CHOL 170 10/20/2021   TRIG 90.0 10/20/2021   HDL 40.10 10/20/2021   LDLCALC 112 (H) 10/20/2021   ALT 19 10/20/2021   AST 20 10/20/2021   NA 137 10/20/2021   K 4.2 10/20/2021   CL 100 10/20/2021   CREATININE 1.18  10/20/2021   BUN 25 (H) 10/20/2021   CO2 26 10/20/2021   TSH 2.00 10/20/2021   PSA 2.94 10/20/2021    US Renal  Result Date: 10/26/2019 CLINICAL DATA:  Slightly decreased GFR. EXAM: RENAL / URINARY TRACT ULTRASOUND COMPLETE COMPARISON:  None. FINDINGS: Right Kidney: Renal measurements: 11.6 x 5.4 x 5.7 cm = volume: 186 mL. Echogenicity within normal limits. No mass or hydronephrosis visualized. Left Kidney: Renal measurements: 10.1 x 6.0 x 5.1 cm = volume: 162 mL. Echogenicity within normal limits. No mass or hydronephrosis visualized. Bladder: Appears normal for degree of bladder distention. Other: Enlarged prostate with a volume of 107 cc. IMPRESSION: 1. The kidneys are normal in appearance. 2. The prostate is enlarged and exerts mass effect on the posterior bladder. Electronically Signed   By: Gerome Sam III M.D   On: 10/26/2019 18:23    Assessment & Plan:   Problem List Items Addressed This Visit       Cardiovascular and Mediastinum   HTN (hypertension)    Try Losartan 75 mg or 100 mg a day if BP is elevated      Relevant Medications   losartan (COZAAR) 50 MG tablet     Digestive   GERD (gastroesophageal reflux disease)    On Protonix        Other   B12 deficiency    Cont w/B12      Dyslipidemia - Primary   Relevant Orders   CT CARDIAC SCORING (SELF PAY ONLY)   Hyperglycemia    Dexcom 7 sample  - CBGs<110 Return to clinic in 6 months with A1c Weight loss, stress reduction suggested again      Vitamin D deficiency    On vitamin D         Meds ordered this encounter  Medications   DISCONTD: losartan (COZAAR) 50 MG tablet    Sig: Take 2 tablets (100 mg total) by mouth daily.    Dispense:  180 tablet    Refill:  3   losartan (COZAAR) 50 MG tablet    Sig: Take 2 tablets (100 mg total) by mouth daily.    Dispense:  180 tablet    Refill:  3      Follow-up: Return in about 6 months (around 11/02/2022) for Wellness Exam.  Sonda Primes, MD

## 2022-05-05 ENCOUNTER — Other Ambulatory Visit (INDEPENDENT_AMBULATORY_CARE_PROVIDER_SITE_OTHER): Payer: 59

## 2022-05-05 DIAGNOSIS — E559 Vitamin D deficiency, unspecified: Secondary | ICD-10-CM | POA: Diagnosis not present

## 2022-05-05 DIAGNOSIS — R739 Hyperglycemia, unspecified: Secondary | ICD-10-CM

## 2022-05-05 DIAGNOSIS — Z0000 Encounter for general adult medical examination without abnormal findings: Secondary | ICD-10-CM | POA: Diagnosis not present

## 2022-05-05 DIAGNOSIS — E538 Deficiency of other specified B group vitamins: Secondary | ICD-10-CM

## 2022-05-05 LAB — COMPREHENSIVE METABOLIC PANEL
ALT: 24 U/L (ref 0–53)
AST: 22 U/L (ref 0–37)
Albumin: 4.6 g/dL (ref 3.5–5.2)
Alkaline Phosphatase: 59 U/L (ref 39–117)
BUN: 27 mg/dL — ABNORMAL HIGH (ref 6–23)
CO2: 29 mEq/L (ref 19–32)
Calcium: 9.8 mg/dL (ref 8.4–10.5)
Chloride: 101 mEq/L (ref 96–112)
Creatinine, Ser: 1.25 mg/dL (ref 0.40–1.50)
GFR: 62.14 mL/min (ref >60.00)
Glucose, Bld: 123 mg/dL — ABNORMAL HIGH (ref 70–99)
Potassium: 4.1 mEq/L (ref 3.5–5.1)
Sodium: 139 mEq/L (ref 135–145)
Total Bilirubin: 0.6 mg/dL (ref 0.2–1.2)
Total Protein: 7.9 g/dL (ref 6.0–8.3)

## 2022-05-05 LAB — VITAMIN D 25 HYDROXY (VIT D DEFICIENCY, FRACTURES): VITD: 40.5 ng/mL (ref 30.00–100.00)

## 2022-05-05 LAB — HEMOGLOBIN A1C: Hgb A1c MFr Bld: 6 % (ref 4.6–6.5)

## 2022-05-07 ENCOUNTER — Encounter: Payer: Self-pay | Admitting: Internal Medicine

## 2022-05-30 ENCOUNTER — Other Ambulatory Visit: Payer: Self-pay

## 2022-05-30 ENCOUNTER — Other Ambulatory Visit (HOSPITAL_COMMUNITY): Payer: Self-pay

## 2022-09-13 ENCOUNTER — Other Ambulatory Visit (HOSPITAL_COMMUNITY): Payer: Self-pay

## 2022-10-10 ENCOUNTER — Encounter: Payer: Self-pay | Admitting: Internal Medicine

## 2022-10-11 ENCOUNTER — Other Ambulatory Visit (INDEPENDENT_AMBULATORY_CARE_PROVIDER_SITE_OTHER): Payer: 59

## 2022-10-11 DIAGNOSIS — E559 Vitamin D deficiency, unspecified: Secondary | ICD-10-CM

## 2022-10-11 DIAGNOSIS — Z0000 Encounter for general adult medical examination without abnormal findings: Secondary | ICD-10-CM

## 2022-10-11 DIAGNOSIS — R739 Hyperglycemia, unspecified: Secondary | ICD-10-CM | POA: Diagnosis not present

## 2022-10-11 LAB — CBC WITH DIFFERENTIAL/PLATELET
Basophils Absolute: 0 10*3/uL (ref 0.0–0.1)
Basophils Relative: 0.9 % (ref 0.0–3.0)
Eosinophils Absolute: 0.1 10*3/uL (ref 0.0–0.7)
Eosinophils Relative: 2.4 % (ref 0.0–5.0)
HCT: 45.2 % (ref 39.0–52.0)
Hemoglobin: 15.4 g/dL (ref 13.0–17.0)
Lymphocytes Relative: 45.6 % (ref 12.0–46.0)
Lymphs Abs: 2.4 10*3/uL (ref 0.7–4.0)
MCHC: 34 g/dL (ref 30.0–36.0)
MCV: 87.3 fL (ref 78.0–100.0)
Monocytes Absolute: 0.5 10*3/uL (ref 0.1–1.0)
Monocytes Relative: 9.9 % (ref 3.0–12.0)
Neutro Abs: 2.1 10*3/uL (ref 1.4–7.7)
Neutrophils Relative %: 41.2 % — ABNORMAL LOW (ref 43.0–77.0)
Platelets: 253 10*3/uL (ref 150.0–400.0)
RBC: 5.18 Mil/uL (ref 4.22–5.81)
RDW: 13.7 % (ref 11.5–15.5)
WBC: 5.2 10*3/uL (ref 4.0–10.5)

## 2022-10-11 LAB — URINALYSIS
Bilirubin Urine: NEGATIVE
Hgb urine dipstick: NEGATIVE
Ketones, ur: NEGATIVE
Leukocytes,Ua: NEGATIVE
Nitrite: NEGATIVE
Specific Gravity, Urine: 1.025 (ref 1.000–1.030)
Total Protein, Urine: NEGATIVE
Urine Glucose: NEGATIVE
Urobilinogen, UA: 0.2 (ref 0.0–1.0)
pH: 6 (ref 5.0–8.0)

## 2022-10-11 LAB — LIPID PANEL
Cholesterol: 176 mg/dL (ref 0–200)
HDL: 42.2 mg/dL (ref >39.00)
LDL Cholesterol: 110 mg/dL — ABNORMAL HIGH (ref 0–99)
NonHDL: 133.52
Total CHOL/HDL Ratio: 4
Triglycerides: 118 mg/dL (ref 0.0–149.0)
VLDL: 23.6 mg/dL (ref 0.0–40.0)

## 2022-10-11 LAB — VITAMIN B12: Vitamin B-12: 534 pg/mL (ref 211–911)

## 2022-10-11 LAB — COMPREHENSIVE METABOLIC PANEL
ALT: 26 U/L (ref 0–53)
AST: 21 U/L (ref 0–37)
Albumin: 4.4 g/dL (ref 3.5–5.2)
Alkaline Phosphatase: 62 U/L (ref 39–117)
BUN: 28 mg/dL — ABNORMAL HIGH (ref 6–23)
CO2: 27 meq/L (ref 19–32)
Calcium: 9.5 mg/dL (ref 8.4–10.5)
Chloride: 103 meq/L (ref 96–112)
Creatinine, Ser: 1.33 mg/dL (ref 0.40–1.50)
GFR: 57.51 mL/min — ABNORMAL LOW (ref >60.00)
Glucose, Bld: 125 mg/dL — ABNORMAL HIGH (ref 70–99)
Potassium: 3.8 meq/L (ref 3.5–5.1)
Sodium: 138 meq/L (ref 135–145)
Total Bilirubin: 0.6 mg/dL (ref 0.2–1.2)
Total Protein: 7.3 g/dL (ref 6.0–8.3)

## 2022-10-11 LAB — TSH: TSH: 2.41 u[IU]/mL (ref 0.35–5.50)

## 2022-10-11 LAB — MICROALBUMIN / CREATININE URINE RATIO
Creatinine,U: 116.9 mg/dL
Microalb Creat Ratio: 0.9 mg/g (ref 0.0–30.0)
Microalb, Ur: 1.1 mg/dL (ref 0.0–1.9)

## 2022-10-11 LAB — PSA: PSA: 10.95 ng/mL — ABNORMAL HIGH (ref 0.10–4.00)

## 2022-10-11 LAB — HEMOGLOBIN A1C: Hgb A1c MFr Bld: 6 % (ref 4.6–6.5)

## 2022-10-12 ENCOUNTER — Encounter (HOSPITAL_COMMUNITY): Payer: Self-pay | Admitting: Emergency Medicine

## 2022-10-12 ENCOUNTER — Emergency Department (HOSPITAL_COMMUNITY): Payer: 59

## 2022-10-12 ENCOUNTER — Other Ambulatory Visit: Payer: Self-pay

## 2022-10-12 ENCOUNTER — Emergency Department (HOSPITAL_COMMUNITY)
Admission: EM | Admit: 2022-10-12 | Discharge: 2022-10-12 | Disposition: A | Discharge status: Home / Self Care | Payer: 59 | Attending: Emergency Medicine | Admitting: Emergency Medicine

## 2022-10-12 DIAGNOSIS — S62522A Displaced fracture of distal phalanx of left thumb, initial encounter for closed fracture: Secondary | ICD-10-CM | POA: Diagnosis not present

## 2022-10-12 DIAGNOSIS — S80212A Abrasion, left knee, initial encounter: Secondary | ICD-10-CM | POA: Diagnosis not present

## 2022-10-12 DIAGNOSIS — S50311A Abrasion of right elbow, initial encounter: Secondary | ICD-10-CM | POA: Diagnosis not present

## 2022-10-12 DIAGNOSIS — S62525A Nondisplaced fracture of distal phalanx of left thumb, initial encounter for closed fracture: Secondary | ICD-10-CM | POA: Diagnosis not present

## 2022-10-12 DIAGNOSIS — W19XXXA Unspecified fall, initial encounter: Secondary | ICD-10-CM | POA: Diagnosis not present

## 2022-10-12 DIAGNOSIS — S0990XA Unspecified injury of head, initial encounter: Secondary | ICD-10-CM | POA: Insufficient documentation

## 2022-10-12 DIAGNOSIS — T07XXXA Unspecified multiple injuries, initial encounter: Secondary | ICD-10-CM

## 2022-10-12 DIAGNOSIS — M4802 Spinal stenosis, cervical region: Secondary | ICD-10-CM | POA: Diagnosis not present

## 2022-10-12 DIAGNOSIS — S63219A Subluxation of metacarpophalangeal joint of unspecified finger, initial encounter: Secondary | ICD-10-CM | POA: Diagnosis not present

## 2022-10-12 DIAGNOSIS — M542 Cervicalgia: Secondary | ICD-10-CM | POA: Diagnosis not present

## 2022-10-12 DIAGNOSIS — S80211A Abrasion, right knee, initial encounter: Secondary | ICD-10-CM | POA: Insufficient documentation

## 2022-10-12 DIAGNOSIS — Z23 Encounter for immunization: Secondary | ICD-10-CM | POA: Insufficient documentation

## 2022-10-12 DIAGNOSIS — S199XXA Unspecified injury of neck, initial encounter: Secondary | ICD-10-CM | POA: Diagnosis not present

## 2022-10-12 DIAGNOSIS — R413 Other amnesia: Secondary | ICD-10-CM | POA: Diagnosis not present

## 2022-10-12 DIAGNOSIS — M47812 Spondylosis without myelopathy or radiculopathy, cervical region: Secondary | ICD-10-CM | POA: Diagnosis not present

## 2022-10-12 DIAGNOSIS — S62609A Fracture of unspecified phalanx of unspecified finger, initial encounter for closed fracture: Secondary | ICD-10-CM | POA: Diagnosis not present

## 2022-10-12 DIAGNOSIS — S069X9A Unspecified intracranial injury with loss of consciousness of unspecified duration, initial encounter: Secondary | ICD-10-CM

## 2022-10-12 DIAGNOSIS — S6992XA Unspecified injury of left wrist, hand and finger(s), initial encounter: Secondary | ICD-10-CM | POA: Diagnosis present

## 2022-10-12 MED ORDER — TETANUS-DIPHTH-ACELL PERTUSSIS 5-2.5-18.5 LF-MCG/0.5 IM SUSY
0.5000 mL | PREFILLED_SYRINGE | Freq: Once | INTRAMUSCULAR | Status: AC
  Administered 2022-10-12: 0.5 mL via INTRAMUSCULAR
  Filled 2022-10-12: qty 0.5
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
## 2022-10-12 NOTE — Progress Notes (Signed)
Orthopedic Tech Progress Note Patient Details:  Blake Khan Apr 23, 1960 [id number]  Ortho Devices Type of Ortho Device: Thumb spica splint, Finger splint Splint Material: Plaster Ortho Device/Splint Location: left Ortho Device/Splint Interventions: Ordered, Application, Adjustment   Post Interventions Patient Tolerated: Well Instructions Provided: Care of device, Adjustment of device  Kizzie Fantasia 10/12/2022, 8:56 PM

## 2022-10-12 NOTE — Discharge Instructions (Signed)
Follow up with Dr. Amanda Pea as planned for further treatment of left thumb injuries. Ice and elevate to reduce swelling. Take ibuprofen and/or Tylenol for pain and inflammation.   Return to the ED with any new or concerning symptoms.

## 2022-10-13 DIAGNOSIS — S62639A Displaced fracture of distal phalanx of unspecified finger, initial encounter for closed fracture: Secondary | ICD-10-CM | POA: Insufficient documentation

## 2022-10-13 DIAGNOSIS — S62522A Displaced fracture of distal phalanx of left thumb, initial encounter for closed fracture: Secondary | ICD-10-CM | POA: Diagnosis not present

## 2022-10-13 DIAGNOSIS — M79642 Pain in left hand: Secondary | ICD-10-CM | POA: Diagnosis not present

## 2022-10-13 DIAGNOSIS — S5332XA Traumatic rupture of left ulnar collateral ligament, initial encounter: Secondary | ICD-10-CM | POA: Diagnosis not present

## 2022-10-13 DIAGNOSIS — S63649A Sprain of metacarpophalangeal joint of unspecified thumb, initial encounter: Secondary | ICD-10-CM | POA: Insufficient documentation

## 2022-10-19 ENCOUNTER — Other Ambulatory Visit: Payer: Self-pay

## 2022-10-19 ENCOUNTER — Ambulatory Visit (INDEPENDENT_AMBULATORY_CARE_PROVIDER_SITE_OTHER): Payer: 59 | Admitting: Internal Medicine

## 2022-10-19 ENCOUNTER — Other Ambulatory Visit (HOSPITAL_COMMUNITY): Payer: Self-pay

## 2022-10-19 VITALS — BP 122/68 | HR 62 | Temp 98.3°F | Ht 71.5 in | Wt 197.0 lb

## 2022-10-19 DIAGNOSIS — Z0000 Encounter for general adult medical examination without abnormal findings: Secondary | ICD-10-CM | POA: Diagnosis not present

## 2022-10-19 DIAGNOSIS — E785 Hyperlipidemia, unspecified: Secondary | ICD-10-CM

## 2022-10-19 DIAGNOSIS — R972 Elevated prostate specific antigen [PSA]: Secondary | ICD-10-CM | POA: Diagnosis not present

## 2022-10-19 DIAGNOSIS — E538 Deficiency of other specified B group vitamins: Secondary | ICD-10-CM

## 2022-10-19 DIAGNOSIS — S060X1D Concussion with loss of consciousness of 30 minutes or less, subsequent encounter: Secondary | ICD-10-CM

## 2022-10-19 DIAGNOSIS — R739 Hyperglycemia, unspecified: Secondary | ICD-10-CM | POA: Diagnosis not present

## 2022-10-19 DIAGNOSIS — S060XAA Concussion with loss of consciousness status unknown, initial encounter: Secondary | ICD-10-CM | POA: Insufficient documentation

## 2022-10-19 DIAGNOSIS — Z1211 Encounter for screening for malignant neoplasm of colon: Secondary | ICD-10-CM

## 2022-10-19 MED ORDER — MUPIROCIN 2 % EX OINT
TOPICAL_OINTMENT | CUTANEOUS | 0 refills | Status: DC
  Filled 2022-10-19: qty 22, 10d supply, fill #0

## 2022-10-19 MED ORDER — LOSARTAN POTASSIUM 50 MG PO TABS
100.0000 mg | ORAL_TABLET | Freq: Every day | ORAL | 3 refills | Status: AC
  Filled 2022-10-19 – 2022-11-26 (×2): qty 180, 90d supply, fill #0
  Filled 2023-03-07: qty 180, 90d supply, fill #1
  Filled 2023-09-02 – 2023-09-08 (×2): qty 180, 90d supply, fill #2

## 2022-10-19 MED ORDER — PANTOPRAZOLE SODIUM 40 MG PO TBEC
40.0000 mg | DELAYED_RELEASE_TABLET | Freq: Every day | ORAL | 3 refills | Status: DC
  Filled 2022-10-19 – 2022-11-26 (×2): qty 90, 90d supply, fill #0
  Filled 2023-03-07: qty 90, 90d supply, fill #1
  Filled 2023-06-05: qty 90, 90d supply, fill #2
  Filled 2023-09-02 – 2023-09-08 (×2): qty 90, 90d supply, fill #3

## 2022-10-19 NOTE — Assessment & Plan Note (Addendum)
  We discussed age appropriate health related issues, including available/recomended screening tests and vaccinations. Labs were ordered to be later reviewed . All questions were answered. We discussed one or more of the following - seat belt use, use of sunscreen/sun exposure exercise, safe sex, fall risk reduction, second hand smoke exposure, firearm use and storage, seat belt use, a need for adhering to healthy diet and exercise. Labs were ordered.  All questions were answered. 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 Colon 2017 Dr Kinnie Scales, due in 2027 Cor calcium CT offered 9/21, 9/22 tDAP 2017Hep C(-) 2018 PSA 1.1 2020 Brad declined Shingrx  Cologuard test was offered. I suggested that Genelle Bal comes back on his work hours
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
## 2022-10-20 DIAGNOSIS — R3915 Urgency of urination: Secondary | ICD-10-CM | POA: Diagnosis not present

## 2022-10-20 DIAGNOSIS — N401 Enlarged prostate with lower urinary tract symptoms: Secondary | ICD-10-CM | POA: Diagnosis not present

## 2022-10-20 DIAGNOSIS — R972 Elevated prostate specific antigen [PSA]: Secondary | ICD-10-CM | POA: Diagnosis not present

## 2022-10-24 DIAGNOSIS — S5332XA Traumatic rupture of left ulnar collateral ligament, initial encounter: Secondary | ICD-10-CM | POA: Diagnosis not present

## 2022-10-24 DIAGNOSIS — S62525A Nondisplaced fracture of distal phalanx of left thumb, initial encounter for closed fracture: Secondary | ICD-10-CM | POA: Diagnosis not present

## 2022-10-24 DIAGNOSIS — S63105A Unspecified dislocation of left thumb, initial encounter: Secondary | ICD-10-CM | POA: Diagnosis not present

## 2022-10-25 ENCOUNTER — Other Ambulatory Visit (HOSPITAL_COMMUNITY): Payer: Self-pay

## 2022-10-25 ENCOUNTER — Other Ambulatory Visit (HOSPITAL_BASED_OUTPATIENT_CLINIC_OR_DEPARTMENT_OTHER): Payer: Self-pay

## 2022-10-25 ENCOUNTER — Encounter: Payer: Self-pay | Admitting: Internal Medicine

## 2022-10-25 DIAGNOSIS — S63418A Traumatic rupture of collateral ligament of other finger at metacarpophalangeal and interphalangeal joint, initial encounter: Secondary | ICD-10-CM | POA: Diagnosis not present

## 2022-10-25 DIAGNOSIS — G8918 Other acute postprocedural pain: Secondary | ICD-10-CM | POA: Diagnosis not present

## 2022-10-25 DIAGNOSIS — R972 Elevated prostate specific antigen [PSA]: Secondary | ICD-10-CM | POA: Insufficient documentation

## 2022-10-25 DIAGNOSIS — S63105A Unspecified dislocation of left thumb, initial encounter: Secondary | ICD-10-CM | POA: Diagnosis not present

## 2022-10-25 DIAGNOSIS — S63125A Dislocation of unspecified interphalangeal joint of left thumb, initial encounter: Secondary | ICD-10-CM | POA: Diagnosis not present

## 2022-10-25 DIAGNOSIS — S6432XA Injury of digital nerve of left thumb, initial encounter: Secondary | ICD-10-CM | POA: Diagnosis not present

## 2022-10-25 DIAGNOSIS — S5332XA Traumatic rupture of left ulnar collateral ligament, initial encounter: Secondary | ICD-10-CM | POA: Diagnosis not present

## 2022-10-25 DIAGNOSIS — S62525A Nondisplaced fracture of distal phalanx of left thumb, initial encounter for closed fracture: Secondary | ICD-10-CM | POA: Diagnosis not present

## 2022-10-25 MED ORDER — ONDANSETRON 8 MG PO TBDP
8.0000 mg | ORAL_TABLET | Freq: Three times a day (TID) | ORAL | 0 refills | Status: DC | PRN
  Filled 2022-10-25 (×2): qty 15, 5d supply, fill #0

## 2022-10-25 MED ORDER — METHOCARBAMOL 500 MG PO TABS
500.0000 mg | ORAL_TABLET | Freq: Four times a day (QID) | ORAL | 0 refills | Status: DC | PRN
  Filled 2022-10-25 (×2): qty 40, 10d supply, fill #0

## 2022-10-25 MED ORDER — OXYCODONE HCL 5 MG PO TABS
5.0000 mg | ORAL_TABLET | ORAL | 0 refills | Status: DC
  Filled 2022-10-25 (×2): qty 42, 7d supply, fill #0

## 2022-10-25 MED ORDER — CEPHALEXIN 500 MG PO CAPS
500.0000 mg | ORAL_CAPSULE | Freq: Four times a day (QID) | ORAL | 0 refills | Status: DC
  Filled 2022-10-25 (×2): qty 28, 7d supply, fill #0

## 2022-10-25 NOTE — Assessment & Plan Note (Signed)
On B12 
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
## 2022-10-25 NOTE — Assessment & Plan Note (Signed)
Urology appointment tomorrow

## 2022-10-26 ENCOUNTER — Other Ambulatory Visit: Payer: Self-pay | Admitting: Urology

## 2022-10-26 DIAGNOSIS — R972 Elevated prostate specific antigen [PSA]: Secondary | ICD-10-CM

## 2022-11-07 DIAGNOSIS — Z4789 Encounter for other orthopedic aftercare: Secondary | ICD-10-CM | POA: Diagnosis not present

## 2022-11-07 DIAGNOSIS — S62522A Displaced fracture of distal phalanx of left thumb, initial encounter for closed fracture: Secondary | ICD-10-CM | POA: Diagnosis not present

## 2022-11-07 DIAGNOSIS — S5332XA Traumatic rupture of left ulnar collateral ligament, initial encounter: Secondary | ICD-10-CM | POA: Diagnosis not present

## 2022-11-16 LAB — COLOGUARD

## 2022-11-23 DIAGNOSIS — Z4789 Encounter for other orthopedic aftercare: Secondary | ICD-10-CM | POA: Diagnosis not present

## 2022-11-23 DIAGNOSIS — S5332XA Traumatic rupture of left ulnar collateral ligament, initial encounter: Secondary | ICD-10-CM | POA: Diagnosis not present

## 2022-11-23 DIAGNOSIS — S62522A Displaced fracture of distal phalanx of left thumb, initial encounter for closed fracture: Secondary | ICD-10-CM | POA: Diagnosis not present

## 2022-11-23 DIAGNOSIS — M25642 Stiffness of left hand, not elsewhere classified: Secondary | ICD-10-CM | POA: Diagnosis not present

## 2022-11-23 DIAGNOSIS — M79645 Pain in left finger(s): Secondary | ICD-10-CM | POA: Diagnosis not present

## 2022-11-26 ENCOUNTER — Other Ambulatory Visit (HOSPITAL_COMMUNITY): Payer: Self-pay

## 2022-11-28 ENCOUNTER — Other Ambulatory Visit: Payer: Self-pay

## 2022-11-29 DIAGNOSIS — R972 Elevated prostate specific antigen [PSA]: Secondary | ICD-10-CM | POA: Diagnosis not present

## 2022-11-30 DIAGNOSIS — M25642 Stiffness of left hand, not elsewhere classified: Secondary | ICD-10-CM | POA: Diagnosis not present

## 2022-12-07 DIAGNOSIS — Z1211 Encounter for screening for malignant neoplasm of colon: Secondary | ICD-10-CM | POA: Diagnosis not present

## 2022-12-15 DIAGNOSIS — M25642 Stiffness of left hand, not elsewhere classified: Secondary | ICD-10-CM | POA: Diagnosis not present

## 2022-12-15 LAB — COLOGUARD: COLOGUARD: NEGATIVE

## 2022-12-26 ENCOUNTER — Ambulatory Visit
Admission: RE | Admit: 2022-12-26 | Discharge: 2022-12-26 | Disposition: A | Discharge status: Home / Self Care | Payer: 59 | Source: Ambulatory Visit | Attending: Urology | Admitting: Urology

## 2022-12-26 DIAGNOSIS — R972 Elevated prostate specific antigen [PSA]: Secondary | ICD-10-CM | POA: Diagnosis not present

## 2022-12-26 MED ORDER — GADOPICLENOL 0.5 MMOL/ML IV SOLN
9.0000 mL | Freq: Once | INTRAVENOUS | Status: AC | PRN
  Administered 2022-12-26: 9 mL via INTRAVENOUS

## 2022-12-28 DIAGNOSIS — M25642 Stiffness of left hand, not elsewhere classified: Secondary | ICD-10-CM | POA: Diagnosis not present

## 2023-01-18 ENCOUNTER — Ambulatory Visit: Payer: 59 | Admitting: Internal Medicine

## 2023-01-18 DIAGNOSIS — M25642 Stiffness of left hand, not elsewhere classified: Secondary | ICD-10-CM | POA: Diagnosis not present

## 2023-01-27 DIAGNOSIS — R972 Elevated prostate specific antigen [PSA]: Secondary | ICD-10-CM | POA: Diagnosis not present

## 2023-03-08 ENCOUNTER — Other Ambulatory Visit (HOSPITAL_COMMUNITY): Payer: Self-pay

## 2023-04-04 ENCOUNTER — Other Ambulatory Visit (INDEPENDENT_AMBULATORY_CARE_PROVIDER_SITE_OTHER)

## 2023-04-04 DIAGNOSIS — R739 Hyperglycemia, unspecified: Secondary | ICD-10-CM | POA: Diagnosis not present

## 2023-04-04 DIAGNOSIS — E785 Hyperlipidemia, unspecified: Secondary | ICD-10-CM | POA: Diagnosis not present

## 2023-04-04 LAB — COMPREHENSIVE METABOLIC PANEL
ALT: 22 U/L (ref 0–53)
AST: 21 U/L (ref 0–37)
Albumin: 4.6 g/dL (ref 3.5–5.2)
Alkaline Phosphatase: 57 U/L (ref 39–117)
BUN: 29 mg/dL — ABNORMAL HIGH (ref 6–23)
CO2: 29 meq/L (ref 19–32)
Calcium: 9.4 mg/dL (ref 8.4–10.5)
Chloride: 101 meq/L (ref 96–112)
Creatinine, Ser: 1.17 mg/dL (ref 0.40–1.50)
GFR: 66.85 mL/min (ref >60.00)
Glucose, Bld: 119 mg/dL — ABNORMAL HIGH (ref 70–99)
Potassium: 4 meq/L (ref 3.5–5.1)
Sodium: 139 meq/L (ref 135–145)
Total Bilirubin: 0.6 mg/dL (ref 0.2–1.2)
Total Protein: 7.7 g/dL (ref 6.0–8.3)

## 2023-04-04 LAB — HEMOGLOBIN A1C: Hgb A1c MFr Bld: 5.9 % (ref 4.6–6.5)

## 2023-04-05 ENCOUNTER — Ambulatory Visit: Payer: 59 | Admitting: Internal Medicine

## 2023-04-05 ENCOUNTER — Encounter: Payer: Self-pay | Admitting: Internal Medicine

## 2023-04-05 VITALS — BP 126/84 | HR 60 | Temp 97.7°F | Ht 71.5 in | Wt 205.2 lb

## 2023-04-05 DIAGNOSIS — M545 Low back pain, unspecified: Secondary | ICD-10-CM

## 2023-04-05 DIAGNOSIS — G8929 Other chronic pain: Secondary | ICD-10-CM

## 2023-04-05 DIAGNOSIS — I10 Essential (primary) hypertension: Secondary | ICD-10-CM | POA: Diagnosis not present

## 2023-04-05 DIAGNOSIS — Z0000 Encounter for general adult medical examination without abnormal findings: Secondary | ICD-10-CM

## 2023-04-05 DIAGNOSIS — E538 Deficiency of other specified B group vitamins: Secondary | ICD-10-CM | POA: Diagnosis not present

## 2023-04-05 DIAGNOSIS — E559 Vitamin D deficiency, unspecified: Secondary | ICD-10-CM

## 2023-04-05 DIAGNOSIS — M25511 Pain in right shoulder: Secondary | ICD-10-CM | POA: Insufficient documentation

## 2023-04-05 NOTE — Assessment & Plan Note (Signed)
 On B12

## 2023-04-05 NOTE — Assessment & Plan Note (Signed)
 Doing well

## 2023-04-05 NOTE — Assessment & Plan Note (Signed)
 On Losartan 50 mg/d

## 2023-04-05 NOTE — Patient Instructions (Signed)

## 2023-04-05 NOTE — Progress Notes (Signed)
 Subjective:  Patient ID: Blake Khan, male    DOB: 09-25-1960  Age: 63 y.o. MRN: [id number]  CC: Medical Management of Chronic Issues (3 Month Follow Up. Reviewing most recent set of labs)   HPI Blake Khan presents for elevated PSA - better - seeing Dr Laverle Patter (MRI was nl) L handed  Outpatient Medications Prior to Visit  Medication Sig Dispense Refill   Cholecalciferol (VITAMIN D3) 50 MCG (2000 UT) capsule Take 1 capsule (2,000 Units total) by mouth daily. 100 capsule 3   Cyanocobalamin (VITAMIN B-12) 1000 MCG SUBL Place 1 tablet (1,000 mcg total) under the tongue daily. 100 tablet 3   losartan (COZAAR) 50 MG tablet Take 2 tablets (100 mg total) by mouth daily. 180 tablet 3   pantoprazole (PROTONIX) 40 MG tablet Take 1 tablet (40 mg total) by mouth daily. 90 tablet 3   cephALEXin (KEFLEX) 500 MG capsule Take 1 capsule (500 mg total) by mouth 4 (four) times daily for 7 days. (Patient not taking: Reported on 04/05/2023) 28 capsule 0   methocarbamol (ROBAXIN) 500 MG tablet Take 1 tablet (500 mg total) by mouth every 6-8 hours as needed for spasm. (Patient not taking: Reported on 04/05/2023) 40 tablet 0   mupirocin ointment (BACTROBAN) 2 % Use as directed on leg wound with dressing and change one to two times daily (Patient not taking: Reported on 04/05/2023) 60 g 0   ondansetron (ZOFRAN-ODT) 8 MG disintegrating tablet Take 1 tablet (8 mg total) by mouth every 8 (eight) hours as needed for nausea (Patient not taking: Reported on 04/05/2023) 15 tablet 0   oxyCODONE (OXY IR/ROXICODONE) 5 MG immediate release tablet Take 1 tablet (5 mg total) by mouth every 4-6 hours as needed for pain. (Patient not taking: Reported on 04/05/2023) 42 tablet 0   No facility-administered medications prior to visit.    ROS: Review of Systems  Objective:  BP 126/84   Pulse 60   Temp 97.7 F (36.5 C)   Ht 5' 11.5" (1.816 m)   Wt 205 lb 3.2 oz (93.1 kg)   SpO2 99%   BMI 28.22 kg/m   BP Readings from  Last 3 Encounters:  04/05/23 126/84  10/19/22 122/68  10/12/22 132/79    Wt Readings from Last 3 Encounters:  04/05/23 205 lb 3.2 oz (93.1 kg)  10/19/22 197 lb (89.4 kg)  10/12/22 199 lb (90.3 kg)    Physical Exam  Lab Results  Component Value Date   WBC 5.2 10/11/2022   HGB 15.4 10/11/2022   HCT 45.2 10/11/2022   PLT 253.0 10/11/2022   GLUCOSE 119 (H) 04/04/2023   CHOL 176 10/11/2022   TRIG 118.0 10/11/2022   HDL 42.20 10/11/2022   LDLCALC 110 (H) 10/11/2022   ALT 22 04/04/2023   AST 21 04/04/2023   NA 139 04/04/2023   K 4.0 04/04/2023   CL 101 04/04/2023   CREATININE 1.17 04/04/2023   BUN 29 (H) 04/04/2023   CO2 29 04/04/2023   TSH 2.41 10/11/2022   PSA 10.95 (H) 10/11/2022   HGBA1C 5.9 04/04/2023   MICROALBUR 1.1 10/11/2022    MR PROSTATE W WO CONTRAST Result Date: 12/27/2022 CLINICAL DATA:  Elevated PSA level of 7.95 in November 2024. R97.20. Family history prostate cancer (father). EXAM: MR PROSTATE WITHOUT AND WITH CONTRAST TECHNIQUE: Multiplanar multisequence MRI images were obtained of the pelvis centered about the prostate. Pre and post contrast images were obtained. CONTRAST:  9 cc [employer] COMPARISON:  None Available. FINDINGS:  Prostate: Encapsulated nodularity in the transition zone compatible with benign prostatic hypertrophy. The enlarged prostate gland mildly indents the bladder base. Minimal linear in bandlike low T2 signal in the right posteromedial peripheral zone at the apex is morphologically compatible with benign postinflammatory finding and is considered PI-RADS category 2. No significant abnormal restriction of diffusion or worrisome peripheral zone early enhancement is identified. No focal lesion of intermediate or higher suspicion for prostate cancer is identified. Volume: 3D volumetric assessment: Prostate volume 96.7 cc (5.7 by 5.3 by 6.1 cm). Transcapsular spread: Absent Seminal vesicle involvement: Absent Neurovascular bundle involvement: Absent  Pelvic adenopathy: Absent Bone metastasis: Absent Other findings: There is evidence of anterolisthesis at L5-S1 along with degenerative facet arthropathy and likely facet fusion at L5-S1. Degenerative subcortical cyst formation in the superior and posterosuperior acetabulum bilaterally. We partially visualize a multilobulated cystic lesion extending just lateral to the left piriformis muscle towards the left posterior acetabulum for example on images 16-44 of series 9, about 2.8 cm in long axis, no associated enhancement, favoring ganglion cyst or left posterior paralabral cyst (paralabral cyst might imply a left posterior acetabular labral tear). The margin of this lesion is currently about 1.1 cm lateral to the left sciatic nerve it is not currently impinging on the sciatic nerve. IMPRESSION: 1. No focal lesion of intermediate or higher suspicion for prostate cancer is identified. 2. Benign prostatic hypertrophy and prostatomegaly. 3. Chronic anterolisthesis at L5-S1 with degenerative facet arthropathy and likely facet fusion at L5-S1. 4. Multilobulated cystic lesion extending just lateral to the left piriformis muscle towards the left posterior acetabulum, favoring ganglion cyst or left posterior paralabral cyst (paralabral cyst might imply a left posterior acetabular labral tear). The margin of this lesion is currently about 1.1 cm lateral to the left sciatic nerve it is not currently impinging on the sciatic nerve. Electronically Signed   By: Gaylyn Rong M.D.   On: 12/27/2022 06:41    Assessment & Plan:   Problem List Items Addressed This Visit     Low back pain   Doing well      Well adult exam   Relevant Orders   TSH   Urinalysis   CBC with Differential/Platelet   Lipid panel   Comprehensive metabolic panel   Hemoglobin A1c   Vitamin B12   VITAMIN D 25 Hydroxy (Vit-D Deficiency, Fractures)   HTN (hypertension)   On Losartan 50 mg/d      Vitamin D deficiency   Relevant Orders    VITAMIN D 25 Hydroxy (Vit-D Deficiency, Fractures)   B12 deficiency   On B12      Relevant Orders   Vitamin B12   Shoulder pain, right - Primary      No orders of the defined types were placed in this encounter.     Follow-up: Return in about 6 months (around 10/06/2023) for Wellness Exam.  Sonda Primes, MD

## 2023-05-12 DIAGNOSIS — R972 Elevated prostate specific antigen [PSA]: Secondary | ICD-10-CM | POA: Diagnosis not present

## 2023-05-17 DIAGNOSIS — R3915 Urgency of urination: Secondary | ICD-10-CM | POA: Diagnosis not present

## 2023-05-17 DIAGNOSIS — R972 Elevated prostate specific antigen [PSA]: Secondary | ICD-10-CM | POA: Diagnosis not present

## 2023-05-17 DIAGNOSIS — N401 Enlarged prostate with lower urinary tract symptoms: Secondary | ICD-10-CM | POA: Diagnosis not present

## 2023-06-05 ENCOUNTER — Other Ambulatory Visit (HOSPITAL_COMMUNITY): Payer: Self-pay

## 2023-08-18 DIAGNOSIS — R972 Elevated prostate specific antigen [PSA]: Secondary | ICD-10-CM | POA: Diagnosis not present

## 2023-09-02 ENCOUNTER — Other Ambulatory Visit (HOSPITAL_COMMUNITY): Payer: Self-pay

## 2023-09-04 ENCOUNTER — Other Ambulatory Visit: Payer: Self-pay

## 2023-09-04 ENCOUNTER — Encounter: Payer: Self-pay | Admitting: Pharmacist

## 2023-09-07 ENCOUNTER — Other Ambulatory Visit: Payer: Self-pay

## 2023-09-08 ENCOUNTER — Other Ambulatory Visit (HOSPITAL_COMMUNITY): Payer: Self-pay

## 2023-10-23 ENCOUNTER — Encounter: Payer: Self-pay | Admitting: Internal Medicine

## 2023-10-23 ENCOUNTER — Other Ambulatory Visit (INDEPENDENT_AMBULATORY_CARE_PROVIDER_SITE_OTHER)

## 2023-10-23 ENCOUNTER — Ambulatory Visit: Admitting: Internal Medicine

## 2023-10-23 ENCOUNTER — Ambulatory Visit: Payer: Self-pay | Admitting: Internal Medicine

## 2023-10-23 VITALS — BP 112/70 | HR 67 | Temp 98.1°F | Ht 71.5 in | Wt 204.6 lb

## 2023-10-23 DIAGNOSIS — E559 Vitamin D deficiency, unspecified: Secondary | ICD-10-CM | POA: Diagnosis not present

## 2023-10-23 DIAGNOSIS — N320 Bladder-neck obstruction: Secondary | ICD-10-CM

## 2023-10-23 DIAGNOSIS — Z0000 Encounter for general adult medical examination without abnormal findings: Secondary | ICD-10-CM | POA: Diagnosis not present

## 2023-10-23 DIAGNOSIS — H6123 Impacted cerumen, bilateral: Secondary | ICD-10-CM

## 2023-10-23 DIAGNOSIS — R972 Elevated prostate specific antigen [PSA]: Secondary | ICD-10-CM

## 2023-10-23 DIAGNOSIS — Z13220 Encounter for screening for lipoid disorders: Secondary | ICD-10-CM | POA: Diagnosis not present

## 2023-10-23 DIAGNOSIS — R55 Syncope and collapse: Secondary | ICD-10-CM

## 2023-10-23 DIAGNOSIS — E538 Deficiency of other specified B group vitamins: Secondary | ICD-10-CM

## 2023-10-23 LAB — COMPREHENSIVE METABOLIC PANEL WITH GFR
ALT: 26 U/L (ref 0–53)
AST: 26 U/L (ref 0–37)
Albumin: 4.5 g/dL (ref 3.5–5.2)
Alkaline Phosphatase: 67 U/L (ref 39–117)
BUN: 24 mg/dL — ABNORMAL HIGH (ref 6–23)
CO2: 25 meq/L (ref 19–32)
Calcium: 9.1 mg/dL (ref 8.4–10.5)
Chloride: 104 meq/L (ref 96–112)
Creatinine, Ser: 1.12 mg/dL (ref 0.40–1.50)
GFR: 70.17 mL/min (ref >60.00)
Glucose, Bld: 133 mg/dL — ABNORMAL HIGH (ref 70–99)
Potassium: 4.4 meq/L (ref 3.5–5.1)
Sodium: 138 meq/L (ref 135–145)
Total Bilirubin: 0.5 mg/dL (ref 0.2–1.2)
Total Protein: 7.6 g/dL (ref 6.0–8.3)

## 2023-10-23 LAB — CBC WITH DIFFERENTIAL/PLATELET
Basophils Absolute: 0 K/uL (ref 0.0–0.1)
Basophils Relative: 1.1 % (ref 0.0–3.0)
Eosinophils Absolute: 0.1 K/uL (ref 0.0–0.7)
Eosinophils Relative: 2.3 % (ref 0.0–5.0)
HCT: 45 % (ref 39.0–52.0)
Hemoglobin: 15.3 g/dL (ref 13.0–17.0)
Lymphocytes Relative: 44.9 % (ref 12.0–46.0)
Lymphs Abs: 2.1 K/uL (ref 0.7–4.0)
MCHC: 33.9 g/dL (ref 30.0–36.0)
MCV: 87 fl (ref 78.0–100.0)
Monocytes Absolute: 0.4 K/uL (ref 0.1–1.0)
Monocytes Relative: 8.8 % (ref 3.0–12.0)
Neutro Abs: 2 K/uL (ref 1.4–7.7)
Neutrophils Relative %: 42.9 % — ABNORMAL LOW (ref 43.0–77.0)
Platelets: 240 K/uL (ref 150.0–400.0)
RBC: 5.17 Mil/uL (ref 4.22–5.81)
RDW: 13.3 % (ref 11.5–15.5)
WBC: 4.7 K/uL (ref 4.0–10.5)

## 2023-10-23 LAB — URINALYSIS
Bilirubin Urine: NEGATIVE
Hgb urine dipstick: NEGATIVE
Ketones, ur: NEGATIVE
Leukocytes,Ua: NEGATIVE
Nitrite: NEGATIVE
Specific Gravity, Urine: 1.02 (ref 1.000–1.030)
Total Protein, Urine: NEGATIVE
Urine Glucose: NEGATIVE
Urobilinogen, UA: 0.2 (ref 0.0–1.0)
pH: 6 (ref 5.0–8.0)

## 2023-10-23 LAB — HEMOGLOBIN A1C: Hgb A1c MFr Bld: 6 % (ref 4.6–6.5)

## 2023-10-23 LAB — TSH: TSH: 1.88 u[IU]/mL (ref 0.35–5.50)

## 2023-10-23 LAB — LIPID PANEL
Cholesterol: 157 mg/dL (ref 0–200)
HDL: 34.7 mg/dL — ABNORMAL LOW (ref >39.00)
LDL Cholesterol: 109 mg/dL — ABNORMAL HIGH (ref 0–99)
NonHDL: 122.14
Total CHOL/HDL Ratio: 5
Triglycerides: 68 mg/dL (ref 0.0–149.0)
VLDL: 13.6 mg/dL (ref 0.0–40.0)

## 2023-10-23 LAB — VITAMIN D 25 HYDROXY (VIT D DEFICIENCY, FRACTURES): VITD: 28.61 ng/mL — ABNORMAL LOW (ref 30.00–100.00)

## 2023-10-23 LAB — VITAMIN B12: Vitamin B-12: 461 pg/mL (ref 211–911)

## 2023-10-23 NOTE — Assessment & Plan Note (Signed)
 On B12

## 2023-10-23 NOTE — Assessment & Plan Note (Signed)
Use OTC wax removing kit

## 2023-10-23 NOTE — Patient Instructions (Signed)

## 2023-10-23 NOTE — Assessment & Plan Note (Signed)
No relapse 

## 2023-10-23 NOTE — Progress Notes (Signed)
 Subjective:  Patient ID: YANG RACK, male    DOB: 1960/12/12  Age: 63 y.o. MRN: [id number]  CC: Annual Exam (Annual Exam)   HPI RODRICK PAYSON presents for a well exam F/u on elevated PSA  Outpatient Medications Prior to Visit  Medication Sig Dispense Refill   Cholecalciferol (VITAMIN D3) 50 MCG (2000 UT) capsule Take 1 capsule (2,000 Units total) by mouth daily. 100 capsule 3   Cyanocobalamin  (VITAMIN B-12) 1000 MCG SUBL Place 1 tablet (1,000 mcg total) under the tongue daily. 100 tablet 3   losartan  (COZAAR ) 50 MG tablet Take 2 tablets (100 mg total) by mouth daily. 180 tablet 3   pantoprazole  (PROTONIX ) 40 MG tablet Take 1 tablet (40 mg total) by mouth daily. 90 tablet 3   No facility-administered medications prior to visit.    ROS: Review of Systems  Constitutional:  Negative for appetite change, fatigue and unexpected weight change.  HENT:  Negative for congestion, nosebleeds, sneezing, sore throat and trouble swallowing.   Eyes:  Negative for itching and visual disturbance.  Respiratory:  Negative for cough.   Cardiovascular:  Negative for chest pain, palpitations and leg swelling.  Gastrointestinal:  Negative for abdominal distention, blood in stool, diarrhea and nausea.  Genitourinary:  Negative for frequency and hematuria.  Musculoskeletal:  Negative for back pain, gait problem, joint swelling and neck pain.  Skin:  Negative for rash.  Neurological:  Negative for dizziness, tremors, speech difficulty and weakness.  Psychiatric/Behavioral:  Negative for agitation, dysphoric mood and sleep disturbance. The patient is not nervous/anxious.     Objective:  BP 112/70   Pulse 67   Temp 98.1 F (36.7 C)   Ht 5' 11.5 (1.816 m)   Wt 204 lb 9.6 oz (92.8 kg)   SpO2 99%   BMI 28.14 kg/m   BP Readings from Last 3 Encounters:  10/23/23 112/70  04/05/23 126/84  10/19/22 122/68    Wt Readings from Last 3 Encounters:  10/23/23 204 lb 9.6 oz (92.8 kg)  04/05/23  205 lb 3.2 oz (93.1 kg)  10/19/22 197 lb (89.4 kg)    Physical Exam Constitutional:      General: He is not in acute distress.    Appearance: He is well-developed.     Comments: NAD  Eyes:     Conjunctiva/sclera: Conjunctivae normal.     Pupils: Pupils are equal, round, and reactive to light.  Neck:     Thyroid : No thyromegaly.     Vascular: No JVD.  Cardiovascular:     Rate and Rhythm: Normal rate and regular rhythm.     Heart sounds: Normal heart sounds. No murmur heard.    No friction rub. No gallop.  Pulmonary:     Effort: Pulmonary effort is normal. No respiratory distress.     Breath sounds: Normal breath sounds. No wheezing or rales.  Chest:     Chest wall: No tenderness.  Abdominal:     General: Bowel sounds are normal. There is no distension.     Palpations: Abdomen is soft. There is no mass.     Tenderness: There is no abdominal tenderness. There is no guarding or rebound.  Musculoskeletal:        General: No tenderness. Normal range of motion.     Cervical back: Normal range of motion.  Lymphadenopathy:     Cervical: No cervical adenopathy.  Skin:    General: Skin is warm and dry.     Findings: No rash.  Neurological:     Mental Status: He is alert and oriented to person, place, and time.     Cranial Nerves: No cranial nerve deficit.     Motor: No abnormal muscle tone.     Coordination: Coordination normal.     Gait: Gait normal.     Deep Tendon Reflexes: Reflexes are normal and symmetric.  Psychiatric:        Behavior: Behavior normal.        Thought Content: Thought content normal.        Judgment: Judgment normal.   Wax B  Lab Results  Component Value Date   WBC 5.2 10/11/2022   HGB 15.4 10/11/2022   HCT 45.2 10/11/2022   PLT 253.0 10/11/2022   GLUCOSE 119 (H) 04/04/2023   CHOL 176 10/11/2022   TRIG 118.0 10/11/2022   HDL 42.20 10/11/2022   LDLCALC 110 (H) 10/11/2022   ALT 22 04/04/2023   AST 21 04/04/2023   NA 139 04/04/2023   K 4.0  04/04/2023   CL 101 04/04/2023   CREATININE 1.17 04/04/2023   BUN 29 (H) 04/04/2023   CO2 29 04/04/2023   TSH 2.41 10/11/2022   PSA 10.95 (H) 10/11/2022   HGBA1C 5.9 04/04/2023    MR PROSTATE W WO CONTRAST Result Date: 12/27/2022 CLINICAL DATA:  Elevated PSA level of 7.95 in November 2024. R97.20. Family history prostate cancer (father). EXAM: MR PROSTATE WITHOUT AND WITH CONTRAST TECHNIQUE: Multiplanar multisequence MRI images were obtained of the pelvis centered about the prostate. Pre and post contrast images were obtained. CONTRAST:  9 cc [employer]  COMPARISON:  None Available. FINDINGS: Prostate: Encapsulated nodularity in the transition zone compatible with benign prostatic hypertrophy. The enlarged prostate gland mildly indents the bladder base. Minimal linear in bandlike low T2 signal in the right posteromedial peripheral zone at the apex is morphologically compatible with benign postinflammatory finding and is considered PI-RADS category 2. No significant abnormal restriction of diffusion or worrisome peripheral zone early enhancement is identified. No focal lesion of intermediate or higher suspicion for prostate cancer is identified. Volume: 3D volumetric assessment: Prostate volume 96.7 cc (5.7 by 5.3 by 6.1 cm). Transcapsular spread: Absent Seminal vesicle involvement: Absent Neurovascular bundle involvement: Absent Pelvic adenopathy: Absent Bone metastasis: Absent Other findings: There is evidence of anterolisthesis at L5-S1 along with degenerative facet arthropathy and likely facet fusion at L5-S1. Degenerative subcortical cyst formation in the superior and posterosuperior acetabulum bilaterally. We partially visualize a multilobulated cystic lesion extending just lateral to the left piriformis muscle towards the left posterior acetabulum for example on images 16-44 of series 9, about 2.8 cm in long axis, no associated enhancement, favoring ganglion cyst or left posterior paralabral cyst  (paralabral cyst might imply a left posterior acetabular labral tear). The margin of this lesion is currently about 1.1 cm lateral to the left sciatic nerve it is not currently impinging on the sciatic nerve. IMPRESSION: 1. No focal lesion of intermediate or higher suspicion for prostate cancer is identified. 2. Benign prostatic hypertrophy and prostatomegaly. 3. Chronic anterolisthesis at L5-S1 with degenerative facet arthropathy and likely facet fusion at L5-S1. 4. Multilobulated cystic lesion extending just lateral to the left piriformis muscle towards the left posterior acetabulum, favoring ganglion cyst or left posterior paralabral cyst (paralabral cyst might imply a left posterior acetabular labral tear). The margin of this lesion is currently about 1.1 cm lateral to the left sciatic nerve it is not currently impinging on the sciatic nerve. Electronically Signed   By: Ryan  Ramond M.D.   On: 12/27/2022 06:41    Assessment & Plan:   Problem List Items Addressed This Visit     B12 deficiency - Primary   On B12      Bladder neck obstruction   Seeing Dr Renda Mild symptoms.  We discussed options.  In the view of Brad's history of syncope would not use Cardura or Hytrin.  We discussed our options to use finasteride and/or Flomax.  Not interested in the moment due to mild nature of the symptoms.      Cerumen impaction   Use OTC wax removing kit      Elevated PSA   Urology appointment - Dr Renda      Syncope   No relapse      Vitamin D  deficiency   On vitamin D       Well adult exam    We discussed age appropriate health related issues, including available/recomended screening tests and vaccinations. Labs were ordered to be later reviewed . All questions were answered. We discussed one or more of the following - seat belt use, use of sunscreen/sun exposure exercise, safe sex, fall risk reduction, second hand smoke exposure, firearm use and storage, seat belt use, a need for  adhering to healthy diet and exercise. Labs were ordered.  All questions were answered. 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     Follow-up: Return in about 1 year (around 10/22/2024) for Wellness Exam.  Marolyn Noel, MD

## 2023-10-23 NOTE — Assessment & Plan Note (Signed)
 Urology appointment - Dr Renda

## 2023-10-23 NOTE — Assessment & Plan Note (Signed)
 On vitamin D

## 2023-10-23 NOTE — Assessment & Plan Note (Signed)
 Seeing Dr Renda Mild symptoms.  We discussed options.  In the view of Brad's history of syncope would not use Cardura or Hytrin.  We discussed our options to use finasteride and/or Flomax.  Not interested in the moment due to mild nature of the symptoms.

## 2023-10-23 NOTE — Assessment & Plan Note (Addendum)
  We discussed age appropriate health related issues, including available/recomended screening tests and vaccinations. Labs were ordered to be later reviewed . All questions were answered. We discussed one or more of the following - seat belt use, use of sunscreen/sun exposure exercise, safe sex, fall risk reduction, second hand smoke exposure, firearm use and storage, seat belt use, a need for adhering to healthy diet and exercise. Labs were ordered.  All questions were answered. 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

## 2023-12-05 ENCOUNTER — Other Ambulatory Visit (HOSPITAL_COMMUNITY): Payer: Self-pay

## 2023-12-05 ENCOUNTER — Other Ambulatory Visit: Payer: Self-pay

## 2023-12-05 ENCOUNTER — Other Ambulatory Visit: Payer: Self-pay | Admitting: Internal Medicine

## 2023-12-05 MED ORDER — PANTOPRAZOLE SODIUM 40 MG PO TBEC
40.0000 mg | DELAYED_RELEASE_TABLET | Freq: Every day | ORAL | 3 refills | Status: AC
  Filled 2023-12-05: qty 90, 90d supply, fill #0

## 2024-01-03 DIAGNOSIS — R972 Elevated prostate specific antigen [PSA]: Secondary | ICD-10-CM | POA: Diagnosis not present

## 2024-10-23 ENCOUNTER — Encounter: Admitting: Internal Medicine
